# Patient Record
Sex: Male | Born: 1950
Health system: Southern US, Community
[De-identification: ages and names within clinical notes are randomized; demographics above are authoritative.]

## PROBLEM LIST (undated history)

## (undated) DIAGNOSIS — Z87438 Personal history of other diseases of male genital organs: Secondary | ICD-10-CM

## (undated) DIAGNOSIS — I10 Essential (primary) hypertension: Secondary | ICD-10-CM

## (undated) DIAGNOSIS — C61 Malignant neoplasm of prostate: Secondary | ICD-10-CM

## (undated) DIAGNOSIS — R351 Nocturia: Secondary | ICD-10-CM

## (undated) DIAGNOSIS — E785 Hyperlipidemia, unspecified: Secondary | ICD-10-CM

## (undated) DIAGNOSIS — Z973 Presence of spectacles and contact lenses: Secondary | ICD-10-CM

## (undated) DIAGNOSIS — N529 Male erectile dysfunction, unspecified: Secondary | ICD-10-CM

## (undated) DIAGNOSIS — Z87442 Personal history of urinary calculi: Secondary | ICD-10-CM

## (undated) DIAGNOSIS — N362 Urethral caruncle: Secondary | ICD-10-CM

## (undated) DIAGNOSIS — I2510 Atherosclerotic heart disease of native coronary artery without angina pectoris: Secondary | ICD-10-CM

## (undated) DIAGNOSIS — I2584 Coronary atherosclerosis due to calcified coronary lesion: Secondary | ICD-10-CM

## (undated) DIAGNOSIS — Z923 Personal history of irradiation: Secondary | ICD-10-CM

## (undated) DIAGNOSIS — R7303 Prediabetes: Secondary | ICD-10-CM

## (undated) HISTORY — DX: Atherosclerotic heart disease of native coronary artery without angina pectoris: I25.10

## (undated) HISTORY — DX: Essential (primary) hypertension: I10

## (undated) HISTORY — DX: Coronary atherosclerosis due to calcified coronary lesion: I25.84

## (undated) HISTORY — DX: Prediabetes: R73.03

## (undated) HISTORY — DX: Hyperlipidemia, unspecified: E78.5

## (undated) HISTORY — DX: Malignant neoplasm of prostate: C61

## (undated) HISTORY — DX: Male erectile dysfunction, unspecified: N52.9

## (undated) HISTORY — PX: OTHER SURGICAL HISTORY: SHX169

## (undated) HISTORY — DX: Personal history of urinary calculi: Z87.442

---

## 2006-08-09 ENCOUNTER — Ambulatory Visit (HOSPITAL_COMMUNITY): Admission: RE | Admit: 2006-08-09 | Discharge: 2006-08-09 | Payer: Self-pay | Admitting: *Deleted

## 2011-09-18 ENCOUNTER — Other Ambulatory Visit: Payer: Self-pay | Admitting: Urology

## 2011-10-05 ENCOUNTER — Encounter (HOSPITAL_BASED_OUTPATIENT_CLINIC_OR_DEPARTMENT_OTHER): Payer: Self-pay | Admitting: *Deleted

## 2011-10-05 NOTE — Progress Notes (Signed)
NPO AFTER MN. ARRIVES AT 0900. NEEDS ISTAT AND EKG.  

## 2011-10-08 NOTE — H&P (Signed)
oblems  1. Benign Prostatic Hypertrophy Without Urinary Obstruction 600.00 2. 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 PSA,Elevated 790.93  History of Present Illness     Brent Harris returns today in f/u.   He has a history of an elevated PSA that was up to 50.8 in Sept 2010 but fell with antibiotics and observation to 2.56 in 5/11.   He had a prostate biopsy in 2007 for an elevated PSA and was found to have chronic prostatitis.  His PSA in February was 9.43 and he had a month of Levaquin called in then.  His  PSA is 7.45 prior to this visit.   He didn't do well with his prior prostate biopsy and had prolonged ED that eventually resolved and he is doing well with that now since he quit drinking.  He has used Viagra at times.  He is voiding ok but has nocturia x 1.  He has some daytime frequency with high fluid intake.  he has no dysuria or hematuria.  He has a good stream without hesitancy. He had treatment for a cavity around the time of the last PSA.  He had some pain with that but it resolved with the Levaquin.   Past Medical History Problems  1. History of  Chronic Bacterial Prostatitis 601.1 2. History of  Complete Colonoscopy 3. History of  Hypercholesterolemia 272.0 4. History of  Hypertension 401.9 5. History of  Nephrolithiasis V13.01  Surgical History Problems  1. History of  Biopsy Of The Prostate Needle 2. History of  Colonoscopy (Fiberoptic)  Current Meds 1. Lisinopril TABS; Therapy: (Recorded:30Apr2008) to 2. Multi-Vitamin TABS; Therapy: (Recorded:30Apr2008) to 3. Red Yeast Rice Powder; Therapy: 10Oct2012 to 4. Viagra 100 MG Oral Tablet; Take as directed; Therapy: 25Jun2009 to (Evaluate:21Nov2013)   Requested for: 26Nov2012; Last Rx:26Nov2012  Allergies Medication  1. No Known Drug Allergies  Family History Problems  1. Maternal history of  Breast Cancer V16.3 2. Paternal history of  Glaucoma 3. Paternal history of  Prostate Cancer V16.42  Social History Problems  1. Alcohol  Use GLASS OF WINE but recently reduced from a bottle a day. 2. Caffeine Use 1 TO 2 CUPS 3. Marital History - Currently Married 4. Never A Smoker 5. Occupation: ARTIST - TECHNICIAN/ART [employer] Denied  6. Tobacco Use  Review of Systems Genitourinary, constitutional, skin, eye, otolaryngeal, hematologic/lymphatic, cardiovascular, pulmonary, endocrine, musculoskeletal, gastrointestinal, neurological and psychiatric system(s) were reviewed and pertinent findings if present are noted.  Gastrointestinal: no diarrhea and no constipation.  Constitutional: no fever.    Vitals Vital Signs [Data Includes: Last 1 Day]  10Apr2013 11:04AM  Blood Pressure: 140 / 82 Temperature: 96.9 F Heart Rate: 88  Physical Exam Constitutional: Well nourished and well developed . No acute distress.  Abdomen: The abdomen is rounded. The abdomen is soft and nontender.  No inguinal hernia is present on the right.  No inguinal hernia is present on the left.  Rectal: Rectal exam demonstrates normal sphincter tone, no tenderness and no masses. Estimated prostate size is 3+. The prostate has no nodularity and is not tender. The left seminal vesicle is nonpalpable. The right seminal vesicle is nonpalpable. The perineum is normal on inspection.  Genitourinary: Examination of the penis demonstrates no discharge, no masses, no lesions and a normal meatus. The scrotum is without lesions. The right epididymis is palpably normal and non-tender. The left epididymis is palpably normal and non-tender. The right testis is non-tender and without masses. The left testis is non-tender and without masses.  Lymphatics: The  femoral and inguinal nodes are not enlarged or tender.    Results/Data Urine [Data Includes: Last 1 Day]   10Apr2013  COLOR YELLOW   APPEARANCE CLEAR   SPECIFIC GRAVITY <1.005   pH 5.5   GLUCOSE NEG mg/dL  BILIRUBIN NEG   KETONE NEG mg/dL  BLOOD NEG   PROTEIN NEG mg/dL  UROBILINOGEN 0.2 mg/dL  NITRITE NEG    LEUKOCYTE ESTERASE NEG    Assessment Assessed  1. PSA,Elevated 790.93 2. 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 Benign Prostatic Hypertrophy Without Urinary Obstruction 600.00   His PSA has been up and down but it is down somewhat on recent antibiotics but it is still well above normal.  His exam remains benign and his voiding symptoms are minimal. He has mild issues with the ED.   Plan Health Maintenance (V70.0)  1. UA With REFLEX  Done: 10Apr2013 10:50AM PSA,Elevated (790.93)  2. CefTRIAXone Sodium 1 GM Injection Solution Reconstituted; INJECT 1  GM Intramuscular; To Be  Done: 10Apr2013; Status: HOLD FOR - Administration 3. Diazepam 10 MG Oral Tablet; Take 1 -2 po one hour prior to procedure; Therapy: 10Apr2013 to  (Last Rx:10Apr2013) 4. Gentamicin Sulfate 40 MG/ML Injection Solution; INJECT 80 MG Intramuscular; To Be Done:  10Apr2013; Status: HOLD FOR - Administration 5. Follow-up Office  Follow-up  Requested for: 10Apr2013 6. PROSTATE BIOPSY ULTRASOUND  Requested for: 10Apr2013   I think it is time that we repeat a biopsy and I have reviewed the risks of the procedure including bleeding, infection and voiding difficulty. I will give him valium to take prior to the visit and will give him gentamycin and rocephin the day of the biopsy since he has been on Levaquin recently.

## 2011-10-09 ENCOUNTER — Encounter (HOSPITAL_BASED_OUTPATIENT_CLINIC_OR_DEPARTMENT_OTHER): Payer: Self-pay | Admitting: Anesthesiology

## 2011-10-09 ENCOUNTER — Ambulatory Visit (HOSPITAL_BASED_OUTPATIENT_CLINIC_OR_DEPARTMENT_OTHER): Payer: BC Managed Care – PPO | Admitting: Anesthesiology

## 2011-10-09 ENCOUNTER — Ambulatory Visit (HOSPITAL_BASED_OUTPATIENT_CLINIC_OR_DEPARTMENT_OTHER)
Admission: RE | Admit: 2011-10-09 | Discharge: 2011-10-09 | Disposition: A | Discharge status: Home / Self Care | Payer: BC Managed Care – PPO | Source: Ambulatory Visit | Attending: Urology | Admitting: Urology

## 2011-10-09 ENCOUNTER — Encounter (HOSPITAL_BASED_OUTPATIENT_CLINIC_OR_DEPARTMENT_OTHER)
Admission: RE | Disposition: A | Discharge status: Home / Self Care | Payer: Self-pay | Source: Ambulatory Visit | Attending: Urology

## 2011-10-09 ENCOUNTER — Encounter (HOSPITAL_BASED_OUTPATIENT_CLINIC_OR_DEPARTMENT_OTHER): Payer: Self-pay | Admitting: *Deleted

## 2011-10-09 DIAGNOSIS — Z803 Family history of malignant neoplasm of breast: Secondary | ICD-10-CM | POA: Insufficient documentation

## 2011-10-09 DIAGNOSIS — Z8042 Family history of malignant neoplasm of prostate: Secondary | ICD-10-CM | POA: Insufficient documentation

## 2011-10-09 DIAGNOSIS — C61 Malignant neoplasm of prostate: Secondary | ICD-10-CM | POA: Insufficient documentation

## 2011-10-09 DIAGNOSIS — N400 Enlarged prostate without lower urinary tract symptoms: Secondary | ICD-10-CM | POA: Insufficient documentation

## 2011-10-09 DIAGNOSIS — I10 Essential (primary) hypertension: Secondary | ICD-10-CM | POA: Insufficient documentation

## 2011-10-09 DIAGNOSIS — N529 Male erectile dysfunction, unspecified: Secondary | ICD-10-CM | POA: Insufficient documentation

## 2011-10-09 DIAGNOSIS — E7800 Pure hypercholesterolemia, unspecified: Secondary | ICD-10-CM | POA: Insufficient documentation

## 2011-10-09 HISTORY — DX: Essential (primary) hypertension: I10

## 2011-10-09 HISTORY — DX: Nocturia: R35.1

## 2011-10-09 HISTORY — PX: PROSTATE BIOPSY: SHX241

## 2011-10-09 HISTORY — DX: Personal history of urinary calculi: Z87.442

## 2011-10-09 LAB — POCT I-STAT 4, (NA,K, GLUC, HGB,HCT): Sodium: 143 mEq/L (ref 135–145)

## 2011-10-09 SURGERY — BIOPSY, PROSTATE, RECTAL APPROACH, WITH US GUIDANCE
Anesthesia: General | Site: Rectum | Wound class: Contaminated

## 2011-10-09 MED ORDER — LIDOCAINE HCL (CARDIAC) 20 MG/ML IV SOLN
INTRAVENOUS | Status: DC | PRN
  Administered 2011-10-09: 60 mg via INTRAVENOUS

## 2011-10-09 MED ORDER — MEPERIDINE HCL 25 MG/ML IJ SOLN: 6.2500 mg | INTRAMUSCULAR | Status: DC | PRN

## 2011-10-09 MED ORDER — OXYCODONE HCL 5 MG PO TABS: 5.0000 mg | ORAL_TABLET | ORAL | Status: DC | PRN

## 2011-10-09 MED ORDER — MIDAZOLAM HCL 5 MG/5ML IJ SOLN
INTRAMUSCULAR | Status: DC | PRN
  Administered 2011-10-09: 2 mg via INTRAVENOUS

## 2011-10-09 MED ORDER — EPHEDRINE SULFATE 50 MG/ML IJ SOLN
INTRAMUSCULAR | Status: DC | PRN
  Administered 2011-10-09 (×2): 10 mg via INTRAVENOUS

## 2011-10-09 MED ORDER — LACTATED RINGERS IV SOLN
INTRAVENOUS | Status: DC
  Administered 2011-10-09: 100 mL/h via INTRAVENOUS

## 2011-10-09 MED ORDER — ACETAMINOPHEN 325 MG PO TABS: 650.0000 mg | ORAL_TABLET | ORAL | Status: DC | PRN

## 2011-10-09 MED ORDER — DEXTROSE 5 % IV SOLN
1.0000 g | Freq: Once | INTRAVENOUS | Status: AC
  Administered 2011-10-09: 10:00:00 via INTRAVENOUS

## 2011-10-09 MED ORDER — ACETAMINOPHEN 650 MG RE SUPP: 650.0000 mg | RECTAL | Status: DC | PRN

## 2011-10-09 MED ORDER — FENTANYL CITRATE 0.05 MG/ML IJ SOLN
INTRAMUSCULAR | Status: DC | PRN
  Administered 2011-10-09 (×2): 50 ug via INTRAVENOUS

## 2011-10-09 MED ORDER — ONDANSETRON HCL 4 MG/2ML IJ SOLN: 4.0000 mg | Freq: Four times a day (QID) | INTRAMUSCULAR | Status: DC | PRN

## 2011-10-09 MED ORDER — PROMETHAZINE HCL 25 MG/ML IJ SOLN: 6.2500 mg | INTRAMUSCULAR | Status: DC | PRN

## 2011-10-09 MED ORDER — SODIUM CHLORIDE 0.9 % IJ SOLN: 3.0000 mL | INTRAMUSCULAR | Status: DC | PRN

## 2011-10-09 MED ORDER — FENTANYL CITRATE 0.05 MG/ML IJ SOLN: 25.0000 ug | INTRAMUSCULAR | Status: DC | PRN

## 2011-10-09 MED ORDER — PROPOFOL 10 MG/ML IV EMUL
INTRAVENOUS | Status: DC | PRN
  Administered 2011-10-09: 200 mg via INTRAVENOUS

## 2011-10-09 MED ORDER — LACTATED RINGERS IV SOLN: INTRAVENOUS | Status: DC

## 2011-10-09 MED ORDER — SODIUM CHLORIDE 0.9 % IV SOLN: 250.0000 mL | INTRAVENOUS | Status: DC | PRN

## 2011-10-09 MED ORDER — GENTAMICIN SULFATE 40 MG/ML IJ SOLN
160.0000 mg | INTRAVENOUS | Status: AC
  Administered 2011-10-09: 160 mg via INTRAVENOUS

## 2011-10-09 MED ORDER — SODIUM CHLORIDE 0.9 % IJ SOLN: 3.0000 mL | Freq: Two times a day (BID) | INTRAMUSCULAR | Status: DC

## 2011-10-09 SURGICAL SUPPLY — 5 items
DRESSING TELFA 8X3 (GAUZE/BANDAGES/DRESSINGS) ×4 IMPLANT
GLOVE SURG SS PI 8.0 STRL IVOR (GLOVE) IMPLANT
SURGILUBE 2OZ TUBE FLIPTOP (MISCELLANEOUS) IMPLANT
TOWEL OR 17X24 6PK STRL BLUE (TOWEL DISPOSABLE) ×2 IMPLANT
UNDERPAD 30X30 INCONTINENT (UNDERPADS AND DIAPERS) ×2 IMPLANT

## 2011-10-09 NOTE — Anesthesia Procedure Notes (Signed)
Procedure Name: LMA Insertion Date/Time: 10/09/2011 10:12 AM Performed by: Renella Cunas D Pre-anesthesia Checklist: Patient identified, Emergency Drugs available, Suction available and Patient being monitored Patient Re-evaluated:Patient Re-evaluated prior to inductionOxygen Delivery Method: Circle System Utilized Preoxygenation: Pre-oxygenation with 100% oxygen Intubation Type: IV induction Ventilation: Mask ventilation without difficulty LMA: LMA inserted LMA Size: 4.0 Number of attempts: 1 Airway Equipment and Method: bite block Placement Confirmation: positive ETCO2 Tube secured with: Tape Dental Injury: Teeth and Oropharynx as per pre-operative assessment

## 2011-10-09 NOTE — Discharge Instructions (Addendum)
  Post Anesthesia Home Care Instructions  Activity: Get plenty of rest for the remainder of the day. A responsible adult should stay with you for 24 hours following the procedure.  For the next 24 hours, DO NOT: -Drive a car -Advertising copywriter -Drink alcoholic beverages -Take any medication unless instructed by your physician -Make any legal decisions or sign important papers.  Meals: Start with liquid foods such as gelatin or soup. Progress to regular foods as tolerated. Avoid greasy, spicy, heavy foods. If nausea and/or vomiting occur, drink only clear liquids until the nausea and/or vomiting subsides. Call your physician if vomiting continues.  Special Instructions/Symptoms: Your throat may feel dry or sore from the anesthesia or the breathing tube placed in your throat during surgery. If this causes discomfort, gargle with warm salt water. The discomfort should disappear within 24 hours.     Prostate Biopsy TRUS Biopsy BEFORE THE TEST   Do not take aspirin. Do not take any medicine that has aspirin in it 7 days before your biopsy.   You may be given a medicine to take on the day of your biopsy.   You may also be given a medicine or treatment to help you go poop (laxative or enema).  AFTER THE TEST  Only take medicine as told by your doctor.   It is normal to have some bleeding from your rectum for the first 5 days.   You may have blood in your pee (urine) or sperm.  Finding out the results of your test Ask when your test results will be ready. Make sure you get your test results. GET HELP RIGHT AWAY IF:  You have a temperature by mouth above 102 F (38.9 C), not controlled by medicine.   You have blood in your pee for more than 5 days.   You have a lot of blood in your pee.   You have bleeding from your rectum for more than 5 days or have a lot of blood in your poop (feces).   You have severe pain.  Document Released: 05/02/2009 Document Revised: 05/03/2011  Document Reviewed: 05/02/2009 [facility] Patient Information 2012 [place], [place].

## 2011-10-09 NOTE — Anesthesia Preprocedure Evaluation (Signed)
Anesthesia Evaluation  Patient identified by MRN, date of birth, ID band Patient awake    Reviewed: Allergy & Precautions, H&P , NPO status , Patient's Chart, lab work & pertinent test results, reviewed documented beta blocker date and time   Airway Mallampati: II TM Distance: >3 FB Neck ROM: full    Dental No notable dental hx.    Pulmonary neg pulmonary ROS,  breath sounds clear to auscultation  Pulmonary exam normal       Cardiovascular Exercise Tolerance: Good hypertension, Pt. on medications negative cardio ROS  Rhythm:regular Rate:Normal     Neuro/Psych negative neurological ROS  negative psych ROS   GI/Hepatic negative GI ROS, Neg liver ROS,   Endo/Other  negative endocrine ROS  Renal/GU negative Renal ROS  negative genitourinary   Musculoskeletal   Abdominal   Peds  Hematology negative hematology ROS (+)   Anesthesia Other Findings   Reproductive/Obstetrics negative OB ROS                           Anesthesia Physical Anesthesia Plan  ASA: II  Anesthesia Plan: General   Post-op Pain Management:    Induction:   Airway Management Planned:   Additional Equipment:   Intra-op Plan:   Post-operative Plan:   Informed Consent: I have reviewed the patients History and Physical, chart, labs and discussed the procedure including the risks, benefits and alternatives for the proposed anesthesia with the patient or authorized representative who has indicated his/her understanding and acceptance.   Dental Advisory Given  Plan Discussed with: CRNA  Anesthesia Plan Comments:         Anesthesia Quick Evaluation

## 2011-10-09 NOTE — Op Note (Signed)
Preop Dx:  Elevated PSA.  Postop Dx: Same.  Procedure:  Transrectal prostate [place] with biopsy.  Surgeon: Bjorn Pippin MD.  Anesthesia: General.  Specimen: 16 prostate cores.  EBL: Minimal.  Comp: none:  Indications:  Mr. Brent Harris is a 61 yo WM with an elevated PSA and benign exam who is to undergo prostate biopsy.  Procedure:  He was given Rocephin and Gentamycin and was taken to the ER where a general anesthetic was induced.   He was turned to the left lateral decubitus postion and the US probe was inserted.   The prostate was imaged and he was found to have a 77cc prostate with a height of 4.4cm, length of 5.7cm and a width of 5.88cm.  The seminal vesicals were normal.  The TZ was enlarged but without lesions.  The PZ had no hypoechogenic lesions and a normal echotexture.  There were a few central calcifications.      After completion of the diagnostic scan, 16 biopsy cores were obtained with 4 at the base, 4 at the mid-base level, 4 at the mid prostate and 4 at the apex with medial and lateral biopsies on the right and left at each level.   There was minimal bleeding with the biopsies.      He was rolled back supine and his anesthetic was reversed.  He was moved to the recovery room in stable condition.  There were no complications.

## 2011-10-09 NOTE — Interval H&P Note (Signed)
History and Physical Interval Note:  10/09/2011 8:20 AM  Brent Harris  has presented today for surgery, with the diagnosis of Elevated Prostatic Specific Antigen  The various methods of treatment have been discussed with the patient and family. After consideration of risks, benefits and other options for treatment, the patient has consented to  Procedure(s) (LRB): BIOPSY TRANSRECTAL ULTRASONIC PROSTATE (TUBP) (N/A) as a surgical intervention .  The patients' history has been reviewed, patient examined, no change in status, stable for surgery.  I have reviewed the patients' chart and labs.  Questions were answered to the patient's satisfaction.     [name],[name]

## 2011-10-09 NOTE — Progress Notes (Signed)
C/o nausea.  Pt dressed , sitting chair preparing for d/c.  Skin w/d, color pale.  Encouraged to sit. Feet elevated

## 2011-10-09 NOTE — Transfer of Care (Signed)
Immediate Anesthesia Transfer of Care Note  Patient: Brent Harris  Procedure(s) Performed: Procedure(s) (LRB): BIOPSY TRANSRECTAL ULTRASONIC PROSTATE (TUBP) (N/A)  Patient Location: PACU  Anesthesia Type: General  Level of Consciousness: awake, oriented, sedated and patient cooperative  Airway & Oxygen Therapy: Patient Spontanous Breathing and Patient connected to face mask oxygen  Post-op Assessment: Report given to PACU RN and Post -op Vital signs reviewed and stable  Post vital signs: Reviewed and stable  Complications: No apparent anesthesia complications

## 2011-10-09 NOTE — Anesthesia Postprocedure Evaluation (Signed)
  Anesthesia Post-op Note  Patient: Brent Harris  Procedure(s) Performed: Procedure(s) (LRB): BIOPSY TRANSRECTAL ULTRASONIC PROSTATE (TUBP) (N/A)  Patient Location: PACU  Anesthesia Type: General  Level of Consciousness: awake and alert   Airway and Oxygen Therapy: Patient Spontanous Breathing  Post-op Pain: mild  Post-op Assessment: Post-op Vital signs reviewed, Patient's Cardiovascular Status Stable, Respiratory Function Stable, Patent Airway and No signs of Nausea or vomiting  Post-op Vital Signs: stable  Complications: No apparent anesthesia complications

## 2011-10-10 ENCOUNTER — Encounter (HOSPITAL_BASED_OUTPATIENT_CLINIC_OR_DEPARTMENT_OTHER): Payer: Self-pay | Admitting: Urology

## 2011-10-15 ENCOUNTER — Encounter (HOSPITAL_BASED_OUTPATIENT_CLINIC_OR_DEPARTMENT_OTHER): Payer: Self-pay

## 2011-10-27 DIAGNOSIS — C61 Malignant neoplasm of prostate: Secondary | ICD-10-CM

## 2011-10-27 HISTORY — DX: Malignant neoplasm of prostate: C61

## 2011-11-06 NOTE — Progress Notes (Signed)
GLEASON SCORE....3 + 3 = 6 T1cNxMx  PROSTATE CANCER.  PROSTATE VOLUME 77cc.  2 cores with 15% INVOLVEMENT at the left medial apex and 20% of the right lateral base.  Has had increase in frequency and nocturia since and has been able to have intercourse.  PSA.....790.93 ON 10/25/11  ALCOHOL....YES ONE GLASS OF WINE/DAY  NEVER SMOKER  MARRIED   NKA

## 2011-11-07 ENCOUNTER — Ambulatory Visit
Admission: RE | Admit: 2011-11-07 | Discharge: 2011-11-07 | Disposition: A | Discharge status: Home / Self Care | Payer: BC Managed Care – PPO | Source: Ambulatory Visit | Attending: Radiation Oncology | Admitting: Radiation Oncology

## 2011-11-07 ENCOUNTER — Encounter: Payer: Self-pay | Admitting: Radiation Oncology

## 2011-11-07 VITALS — BP 128/88 | HR 70 | Temp 97.8°F | Resp 18 | Ht 70.5 in | Wt 164.0 lb

## 2011-11-07 DIAGNOSIS — Z510 Encounter for antineoplastic radiation therapy: Secondary | ICD-10-CM | POA: Insufficient documentation

## 2011-11-07 DIAGNOSIS — C61 Malignant neoplasm of prostate: Secondary | ICD-10-CM

## 2011-11-07 DIAGNOSIS — I10 Essential (primary) hypertension: Secondary | ICD-10-CM | POA: Insufficient documentation

## 2011-11-07 DIAGNOSIS — Z79899 Other long term (current) drug therapy: Secondary | ICD-10-CM | POA: Insufficient documentation

## 2011-11-07 NOTE — Progress Notes (Signed)
Radiation Oncology         [phone] ________________________________  Initial outpatient Consultation  Name: Brent Harris MRN: [id number]  Date: 11/07/2011  DOB: 1950-10-06  WU:[id number], Excell Seltzer, MD , Merri Brunette, MD  REFERRING PHYSICIAN: Anner Crete, MD  DIAGNOSIS: Stage T1c Gleason's 6 (3+3) Adenocarcinoma of the Prostate  HISTORY OF PRESENT ILLNESS::Brent Harris is a 61 y.o. male who is seen out of the courtesy of Dr. Bjorn Pippin for an opinion concerning radiation therapy as part of management of the patient's recently diagnosed prostate cancer. Mr. Ksiazek has been seen by Dr. Annabell Howells for past few years. Patient has been diagnosed with chronic bacterial prostatitis. His PSA in September of 2010 with actually up to 50.8 however with antibiotic therapy this dropped to 2.56. More recently a recent PSA was elevated and despite the month of Levaquin the patient's PSA remained elevated at 7.45.  He did undergo transrectal ultrasound and biopsy. Biopsies from the right base lateral showed a Gleason score 6 (3+3). This  involved 20% of the core. In addition from the left apical medial area prostate cancer was recovered with a Gleason score of 6. This involved 15% of tissue submitted. Remainder of the biopsies showed benign disease.  Patient is now exploring his options for treatment.   PREVIOUS RADIATION THERAPY: No  PAST MEDICAL HISTORY:  has a past medical history of Elevated PSA; Nocturia; History of kidney stones; and Hypertension.    PAST SURGICAL HISTORY: Past Surgical History  Procedure Date  . Surg for arm fx AS TEENAGER  . Prostate biopsy 10/09/2011    Procedure: BIOPSY TRANSRECTAL ULTRASONIC PROSTATE (TUBP);  Surgeon: Anner Crete, MD;  Location: [facility];  Service: Urology;  Laterality: N/A;  1 hour requested for this case  [facility] to bring Ultrasound Equiphment    FAMILY HISTORY:  Significant for follow with history of prostate cancer  diagnosed in his 62s. He was treated with radiation therapy in the [place] area.  SOCIAL HISTORY:  reports that he has quit smoking. His smoking use included Cigarettes. He quit after 2 years of use. He does not have any smokeless tobacco history on file. He reports that he drinks alcohol. He reports that he does not use illicit drugs.  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  ALLERGIES: Nutritional supplements and Onion  MEDICATIONS:  Current Outpatient Prescriptions  Medication Sig Dispense Refill  . cholecalciferol (VITAMIN D) 1000 UNITS tablet Take 1,000 Units by mouth daily.      . fish oil-omega-3 fatty acids 1000 MG capsule Take 2 g by mouth daily.      Marland Kitchen lisinopril (PRINIVIL,ZESTRIL) 20 MG tablet Take 20 mg by mouth daily.      . Multiple Vitamin (MULTIVITAMIN) tablet Take 1 tablet by mouth daily.        REVIEW OF SYSTEMS:  A 15 point review of systems is documented in the electronic medical record. This was obtained by the nursing staff. However, I reviewed this with the patient to discuss relevant findings and make appropriate changes.  Patient typically has nocturia one to 2 times.  He occasionally will have some slowing of his urinary stream in early morning. He has very mild erectile dysfunction which is helped with the Viagra.  Patient completed the international prostate symptom score with total score of 5 representing minimal symptomatology. Most significant scores were and nocturia and weak urinary stream.   PHYSICAL EXAM:  Temperature 97.8 pulse 70 blood pressure 128/88 weight 164 pounds height 5 feet 10-1/2 inches.  The pupils are equal round and reactive to light. The extraocular eye movements are intact. The tongue is midline. There is no secondary infection noted in the oral cavity or posterior pharynx. Patient's teeth are in good repair. Examination of the neck and supraclavicular region reveals no evidence of adenopathy. The axillary areas  are free of adenopathy. Examination of the lungs fields and be clear. The heart has a regular rhythm and rate. Examination of the abdomen reveals to be soft and nontender with normal bowel sounds. The inguinal areas are free of adenopathy. The patient is a normal circumcised male. The testicles are soft without masses. On rectal exam sphincter tone is noted to be normal. Prostate is significantly enlarged at approximately 3+. There's no palpable nodules within the prostate gland.  Extremities reveals no cyanosis clubbing or edema. On neurological examination motor strength is 5 out of 5 in the proximal distal muscle groups in the upper and lower extremities.   LABORATORY DATA:  Lab Results  Component Value Date   HGB 14.6 10/09/2011   HCT 43.0 10/09/2011   Lab Results  Component Value Date   NA 143 10/09/2011   K 4.0 10/09/2011   No results found for this basename: ALT, AST, GGT, ALKPHOS, BILITOT  PSA  7.45   RADIOGRAPHY: No results found.    IMPRESSION: Stage TIc Gleason's 6 adenocarcinoma prostate. I discussed with the patient that his prognosis relates to his presenting PSA, Gleason score and clinical stage. All these factors are favorable for Mr. Delpriore. Given the patient's young age I would not be in favor of watchful waiting. Given the size of the patient's prostate gland he would not be a candidate for radioactive seed implantation. Patient does have 2 good options for treatment that being prostatectomy and external beam radiation therapy. I discussed the pros and cons of both of these treatments approaches in detail. In addition the patient has had in depth discussion with Dr. Annabell Howells concerning radical prostatectomy. In light of  the patient's  age I would be more in favor of proceeding with surgery first reserving radiation for salvage if necessary. However the patient would have equal outcomes with surgery or radiation therapy in this situation. I have asked the patient to call me or Dr.  Annabell Howells once he is made a decision concerning his treatment approach I spent 60 minutes minutes face to face with the patient and more than 50% of that time was spent in counseling and/or coordination of care.   ------------------------------------------------   Billie Lade, PhD, MD

## 2011-12-11 ENCOUNTER — Telehealth: Payer: Self-pay | Admitting: *Deleted

## 2011-12-11 NOTE — Telephone Encounter (Signed)
CALLED PATIENT TO INFORM OF APPTS. AND SIM ON 12-24-11, AND HIS GOLD SEED PLACEMENT ON 12/18/11, ARRIVAL TIME 10:45 AM, LVM FOR A RETURN CALL

## 2011-12-24 ENCOUNTER — Ambulatory Visit: Payer: BC Managed Care – PPO

## 2011-12-24 ENCOUNTER — Ambulatory Visit
Admission: RE | Admit: 2011-12-24 | Discharge: 2011-12-24 | Disposition: A | Discharge status: Home / Self Care | Payer: BC Managed Care – PPO | Source: Ambulatory Visit | Attending: Radiation Oncology | Admitting: Radiation Oncology

## 2011-12-24 ENCOUNTER — Ambulatory Visit
Admission: RE | Admit: 2011-12-24 | Payer: BC Managed Care – PPO | Source: Ambulatory Visit | Admitting: Radiation Oncology

## 2011-12-24 DIAGNOSIS — C61 Malignant neoplasm of prostate: Secondary | ICD-10-CM

## 2011-12-24 NOTE — Progress Notes (Signed)
Met with patient to discuss RO billing.  Dx: 185 Malignant neoplasm of prostate  Attending Rad: Dr. Marian Sorrow Tx: [id number] IMRT x 40

## 2011-12-25 NOTE — Progress Notes (Signed)
  Radiation Oncology         [phone] ________________________________  Name: Brent Harris MRN: [id number]  Date: 12/24/2011  DOB: 09/02/1950  SIMULATION AND TREATMENT PLANNING NOTE  DIAGNOSIS:  Stage TIC Gleason's  6 adenocarcinoma the prostate  NARRATIVE:  The patient was brought to the CT Simulation planning suite.  Identity was confirmed.  All relevant records and images related to the planned course of therapy were reviewed.  The patient freely provided informed written consent to proceed with treatment after reviewing the details related to the planned course of therapy. The consent form was witnessed and verified by the simulation staff.  Then, the patient was set-up in a stable reproducible  supine position for radiation therapy.  CT images were obtained.  Surface markings were placed.  The CT images were loaded into the planning software.  Then the target and avoidance structures were contoured.  Treatment planning then occurred.  The radiation prescription was entered and confirmed.  A total of 1 complex treatment devices were fabricated. I have requested : Intensity Modulated Radiotherapy (IMRT) is medically necessary for this case for the following reason:  Rectal sparing..  I have ordered:daily mvct scan and image guided therapy.  PLAN:  The patient will receive 78.0 Gy in 40 fractions.  ________________________________   Billie Lade, PhD, MD

## 2011-12-28 ENCOUNTER — Encounter: Payer: Self-pay | Admitting: *Deleted

## 2011-12-31 ENCOUNTER — Encounter: Payer: Self-pay | Admitting: Radiation Oncology

## 2011-12-31 NOTE — Progress Notes (Signed)
IMRT simulation/treatment planning note: Brent Harris underwent IMRT simulation/treatment planning in the management of his carcinoma the prostate. IMRT was chosen to decrease the risk for both acute and late bladder and rectal toxicity compared to 3-D conformal and conventional radiation therapy. Dose volume histograms were obtained for the prostate and also avoidance structures including the bladder, rectum, and femoral heads. We easily met the departmental target and avoidance goals. Please see the electronic medical record for specific dose volume histograms. Dr. Eustaquio Boyden prescribing 7800 cGy in 40 sessions utilizing 6 MV photons helical IMRT Tomotherapy. He is to be treated with a company full bladder and will undergo daily MV CT setting up to his gold seeds while observing the rectal/prostate interface.

## 2012-01-02 ENCOUNTER — Ambulatory Visit: Payer: BC Managed Care – PPO

## 2012-01-03 ENCOUNTER — Ambulatory Visit
Admission: RE | Admit: 2012-01-03 | Discharge: 2012-01-03 | Disposition: A | Discharge status: Home / Self Care | Payer: BC Managed Care – PPO | Source: Ambulatory Visit | Attending: Radiation Oncology | Admitting: Radiation Oncology

## 2012-01-03 ENCOUNTER — Encounter: Payer: Self-pay | Admitting: Radiation Oncology

## 2012-01-03 VITALS — Resp 18 | Wt 164.5 lb

## 2012-01-03 DIAGNOSIS — C61 Malignant neoplasm of prostate: Secondary | ICD-10-CM

## 2012-01-03 NOTE — Progress Notes (Signed)
Received patient in the clinic tonight for PUT with Dr. Mitzi Hansen. Patient is alert and oriented to person, place, and time. No distress noted. Steady gait noted. Pleasant affect noted. Patient denies pain at this time. Patient reports, "I haven't been too symptomatic lately." Patient reports on average he gets up 2-3 times per night to void. Patient denies hematuria. Patient denies incontinence. Patient denies diarrhea. Patient reports he is not as active sexually as he was prior. Patient reports he is able to still get an erection but, has no ejaculation. Patient has several questions about intercourse for Dr. Mitzi Hansen. Reported all finding to Dr. Mitzi Hansen.

## 2012-01-03 NOTE — Progress Notes (Signed)
   Department of Radiation Oncology  Phone:  [phone] Fax:        [phone]  Weekly Treatment Note    Name: Brent Harris Date: 01/03/2012 MRN: [id number] DOB: June 28, 1950   Current dose: 1.95 Gy  Current fraction: 1   MEDICATIONS: Current Outpatient Prescriptions  Medication Sig Dispense Refill  . cholecalciferol (VITAMIN D) 1000 UNITS tablet Take 1,000 Units by mouth daily.      . fish oil-omega-3 fatty acids 1000 MG capsule Take 2 g by mouth daily.      Marland Kitchen lisinopril (PRINIVIL,ZESTRIL) 20 MG tablet Take 20 mg by mouth daily.      . Multiple Vitamin (MULTIVITAMIN) tablet Take 1 tablet by mouth daily.         ALLERGIES: Nutritional supplements and Onion   LABORATORY DATA:  Lab Results  Component Value Date   HGB 14.6 10/09/2011   HCT 43.0 10/09/2011   Lab Results  Component Value Date   NA 143 10/09/2011   K 4.0 10/09/2011   No results found for this basename: ALT, AST, GGT, ALKPHOS, BILITOT     NARRATIVE: Brent Harris was seen today for weekly treatment management. The chart was checked and the patient's films were reviewed. The patient has begun his treatment. He received his first fraction today and this went fine. No difficulty so far. The patient did have several questions.  PHYSICAL EXAMINATION: weight is 164 lb 8 oz (74.617 kg). His respiration is 18.        ASSESSMENT: The patient is doing satisfactorily with treatment.  PLAN: We will continue with the patient's radiation treatment as planned.

## 2012-01-04 ENCOUNTER — Ambulatory Visit
Admission: RE | Admit: 2012-01-04 | Discharge: 2012-01-04 | Disposition: A | Discharge status: Home / Self Care | Payer: BC Managed Care – PPO | Source: Ambulatory Visit | Attending: Radiation Oncology | Admitting: Radiation Oncology

## 2012-01-07 ENCOUNTER — Ambulatory Visit
Admission: RE | Admit: 2012-01-07 | Discharge: 2012-01-07 | Disposition: A | Discharge status: Home / Self Care | Payer: BC Managed Care – PPO | Source: Ambulatory Visit | Attending: Radiation Oncology | Admitting: Radiation Oncology

## 2012-01-07 DIAGNOSIS — C61 Malignant neoplasm of prostate: Secondary | ICD-10-CM

## 2012-01-07 NOTE — Progress Notes (Signed)
   Department of Radiation Oncology  Phone:  [phone] Fax:        [phone]   Intensity modulated radiation therapy device note  On 01/03/2012 the patient began his radiation therapy. He also had construction of his IMRT device. Patient treated with helical IMRT. He  will be treated with 4.7 sinogram segments. This constitutes 1 intensity modulated radiation therapy device.  -----------------------------------  Billie Lade, PhD, MD

## 2012-01-08 ENCOUNTER — Ambulatory Visit
Admission: RE | Admit: 2012-01-08 | Discharge: 2012-01-08 | Disposition: A | Discharge status: Home / Self Care | Payer: BC Managed Care – PPO | Source: Ambulatory Visit | Attending: Radiation Oncology | Admitting: Radiation Oncology

## 2012-01-08 ENCOUNTER — Other Ambulatory Visit: Payer: Self-pay | Admitting: Radiation Oncology

## 2012-01-08 ENCOUNTER — Encounter: Payer: Self-pay | Admitting: Radiation Oncology

## 2012-01-08 VITALS — BP 137/87 | HR 58 | Wt 166.1 lb

## 2012-01-08 DIAGNOSIS — R319 Hematuria, unspecified: Secondary | ICD-10-CM | POA: Insufficient documentation

## 2012-01-08 DIAGNOSIS — R3915 Urgency of urination: Secondary | ICD-10-CM | POA: Insufficient documentation

## 2012-01-08 DIAGNOSIS — R300 Dysuria: Secondary | ICD-10-CM | POA: Insufficient documentation

## 2012-01-08 DIAGNOSIS — C61 Malignant neoplasm of prostate: Secondary | ICD-10-CM

## 2012-01-08 DIAGNOSIS — R351 Nocturia: Secondary | ICD-10-CM | POA: Insufficient documentation

## 2012-01-08 LAB — URINALYSIS, MICROSCOPIC - CHCC
Bilirubin (Urine): NEGATIVE
Blood: NEGATIVE
Ketones: NEGATIVE mg/dL
Protein: NEGATIVE mg/dL
RBC / HPF: NEGATIVE (ref 0–2)
Specific Gravity, Urine: 1.005 (ref 1.003–1.035)
pH: 6 (ref 4.6–8.0)

## 2012-01-08 NOTE — Progress Notes (Addendum)
States he has dysuria and nocturia 5-6 times last pm since he was catheterized for his simulation. But grades dysuria as less than a 1 on a scale of 0-10.  Hematuria absent.  Urine for U/A and C&S obtained on yesterday.  Dr. Roselind Messier will review results with Brent Harris today.

## 2012-01-08 NOTE — Progress Notes (Signed)
[facility]    Radiation Oncology [address] [place]     Maryln Gottron, M.D. [place], [place] [address]               Billie Lade, M.D., Ph.D. Phone: [phone]      Molli Hazard A. Kathrynn Running, M.D. Fax: [phone]      Radene Gunning, M.D., Ph.D.         Lurline Hare, M.D.         Grayland Jack, M.D Weekly Treatment Management Note  Name: Brent Harris     MRN: [id number]        CSN: [id number] Date: 01/08/2012      DOB: 09-Jul-1950  CC: Brent Moh, MD         [place]    Status: Outpatient  Diagnosis: There were no encounter diagnoses.  Current Dose: 7.8  Gy  Current Fraction: 4  Planned Dose: 78.0 Gy  Narrative: Brent Harris was seen today for weekly treatment management. The chart was checked and MVCT  were reviewed.  Over the weekend the patient developed hematuria. In addition the patient developed problems with dysuria and urinary urgency as well as nocturia. Patient admits to nocturia approximately 4-5 times last night. In light of these issues the patient did present earlier for  urinalysis and culture. Preliminary review shows no significant signs of infection. There is no blood noted in the urine specimen. The culture is pending at this time.  He denies any bowel complaints at this time.  Patient has been taking 325 mg of aspirin daily. I recommended he cut down to 81 mg enteric coated. patient has no history of heart disease or stroke.  Nutritional supplements and Onion Current Outpatient Prescriptions  Medication Sig Dispense Refill  . cholecalciferol (VITAMIN D) 1000 UNITS tablet Take 1,000 Units by mouth daily.      Marland Kitchen lisinopril (PRINIVIL,ZESTRIL) 20 MG tablet Take 20 mg by mouth daily.      . Multiple Vitamin (MULTIVITAMIN) tablet Take 1 tablet by mouth daily.       Labs:  Lab Results  Component Value Date   HGB 14.6 10/09/2011   HCT 43.0 10/09/2011   Lab Results  Component Value Date   NA 143 10/09/2011   K 4.0 10/09/2011    No results found for this basename: ALT, AST, GGT, ALK, PHOS, BILITOT    Physical Examination:  weight is 166 lb 1.6 oz (75.342 kg). His blood pressure is 137/87 and his pulse is 58.    Wt Readings from Last 3 Encounters:  01/08/12 166 lb 1.6 oz (75.342 kg)  01/03/12 164 lb 8 oz (74.617 kg)  11/07/11 164 lb (74.39 kg)     Lungs - Normal respiratory effort, chest expands symmetrically. Lungs are clear to auscultation, no crackles or wheezes.  Heart has regular rhythm and rate  Abdomen is soft and non tender with normal bowel sounds  Assessment:  Patient is having issues as above. It is certainly very early in his course of treatment to have problems as above. We will await the urine culture to rule out infection causing the symptoms.  Patient does have an enlarged prostate and this may be contributing to his early presentation of symptoms.  I offered consideration for Pyridium or Flomax but at this time the patient would like to treat with over-the-counter medications. I  did recommend that he use Advil or Aleve with food to see if this will help with his  symptoms.  Plan: Continue treatment per original radiation prescription

## 2012-01-09 ENCOUNTER — Ambulatory Visit
Admission: RE | Admit: 2012-01-09 | Discharge: 2012-01-09 | Disposition: A | Discharge status: Home / Self Care | Payer: BC Managed Care – PPO | Source: Ambulatory Visit | Attending: Radiation Oncology | Admitting: Radiation Oncology

## 2012-01-10 ENCOUNTER — Ambulatory Visit
Admission: RE | Admit: 2012-01-10 | Discharge: 2012-01-10 | Disposition: A | Discharge status: Home / Self Care | Payer: BC Managed Care – PPO | Source: Ambulatory Visit | Attending: Radiation Oncology | Admitting: Radiation Oncology

## 2012-01-10 LAB — URINE CULTURE

## 2012-01-11 ENCOUNTER — Ambulatory Visit
Admission: RE | Admit: 2012-01-11 | Discharge: 2012-01-11 | Disposition: A | Discharge status: Home / Self Care | Payer: BC Managed Care – PPO | Source: Ambulatory Visit | Attending: Radiation Oncology | Admitting: Radiation Oncology

## 2012-01-11 ENCOUNTER — Encounter: Payer: Self-pay | Admitting: Radiation Oncology

## 2012-01-11 VITALS — BP 137/90 | HR 67 | Temp 97.0°F | Resp 20 | Wt 167.8 lb

## 2012-01-11 DIAGNOSIS — C61 Malignant neoplasm of prostate: Secondary | ICD-10-CM

## 2012-01-11 NOTE — Progress Notes (Signed)
Patient  Alert,oriented x3, saw Mdf Tuesday, neg. U/a and culture, not taking ASA anymore," I drink tons of water up to 1230 at night, nocturia 4-5x, mostly complains today of generalized achiness still at lower abdomen,: he  is taking advil 1 200mg  oral every  6 hours as needed", ,advil is working great", last advil taken today at 0830am, ," stream is weaker at night, describes dysuria as warm discomfort at times, not every tim he voids, bowel movemment s are good stated patient" 10:40 AM

## 2012-01-11 NOTE — Progress Notes (Signed)
  Radiation Oncology         [phone] ________________________________  Name: Brent Harris MRN: [id number]  Date: 01/11/2012  DOB: Jan 04, 1951  Weekly Radiation Therapy Management  Current Dose: 13.65 Gy     Planned Dose:  78 Gy  Narrative . . . . . . . . The patient presents for routine under treatment assessment.                                                     The patient had hematuria which is better, an mild dysuria.                                 Set-up films were reviewed.                                 The chart was checked. Physical Findings. . Ceasar Mons Vitals:   01/11/12 1037  BP: 137/90  Pulse: 67  Temp: 97 F (36.1 C)  Resp: 20   Weight essentially stable.  No significant changes. Impression . . . . . . . The patient is  tolerating radiation. Plan . . . . . . . . . . . . Continue treatment as planned.  ________________________________  Artist Pais. Kathrynn Running, M.D.

## 2012-01-14 ENCOUNTER — Encounter: Payer: Self-pay | Admitting: Radiation Oncology

## 2012-01-14 ENCOUNTER — Telehealth: Payer: Self-pay | Admitting: Radiation Oncology

## 2012-01-14 ENCOUNTER — Ambulatory Visit
Admission: RE | Admit: 2012-01-14 | Discharge: 2012-01-14 | Disposition: A | Discharge status: Home / Self Care | Payer: BC Managed Care – PPO | Source: Ambulatory Visit | Attending: Radiation Oncology | Admitting: Radiation Oncology

## 2012-01-14 VITALS — BP 143/92 | HR 64 | Temp 97.1°F | Resp 20 | Wt 170.1 lb

## 2012-01-14 DIAGNOSIS — C61 Malignant neoplasm of prostate: Secondary | ICD-10-CM

## 2012-01-14 NOTE — Progress Notes (Signed)
[facility]    Radiation Oncology [address] [place]     Maryln Gottron, M.D. [place], [place] [address]               Billie Lade, M.D., Ph.D. Phone: [phone]      Molli Hazard A. Kathrynn Running, M.D. Fax: [phone]      Radene Gunning, M.D., Ph.D.         Lurline Hare, M.D.         Grayland Jack, M.D Weekly Treatment Management Note  Name: Brent Harris     MRN: [id number]        CSN: [id number] Date: 01/14/2012      DOB: [birth year]  CC: [name], MD         Ph[place]    Status: Outpatient  Diagnosis: The encounter diagnosis was Prostate cancer.  Current Dose: 15.6 Gy  Current Fraction: 8  Planned Dose: 78.0 Gy  Narrative: Brent Harris was seen today for weekly treatment management. The chart was checked and MVCT  were reviewed. He continues to have some dysuria which is slightly worsened over the weekend. Patient in addition has urinary frequency and some urgency. He has nocturia every 2 hours in the evening. The patient at this time does wish to proceed with medication and have placed him on Flomax 0.4 mg at bedtime. I cautioned the patient concerning hypotension.  Nutritional supplements and Onion Current Outpatient Prescriptions  Medication Sig Dispense Refill  . cholecalciferol (VITAMIN D) 1000 UNITS tablet Take 1,000 Units by mouth daily.      Marland Kitchen ibuprofen (ADVIL,MOTRIN) 200 MG tablet Take 200 mg by mouth every 6 (six) hours as needed.      Marland Kitchen lisinopril (PRINIVIL,ZESTRIL) 20 MG tablet Take 20 mg by mouth daily.      . Multiple Vitamin (MULTIVITAMIN) tablet Take 1 tablet by mouth daily.       Labs:  Lab Results  Component Value Date   HGB 14.6 10/09/2011   HCT 43.0 10/09/2011   Lab Results  Component Value Date   NA 143 10/09/2011   K 4.0 10/09/2011   No results found for this basename: ALT, AST, GGT, ALK, PHOS, BILITOT    Physical Examination:  weight is 170 lb 1.6 oz (77.157 kg). His oral temperature is 97.1 F (36.2 C). His blood  pressure is 143/92 and his pulse is 64. His respiration is 20.    Wt Readings from Last 3 Encounters:  01/14/12 170 lb 1.6 oz (77.157 kg)  01/11/12 167 lb 12.8 oz (76.114 kg)  01/08/12 166 lb 1.6 oz (75.342 kg)     Lungs - Normal respiratory effort, chest expands symmetrically. Lungs are clear to auscultation, no crackles or wheezes.  Heart has regular rhythm and rate  Abdomen is soft and non tender with normal bowel sounds  Assessment:  Patient  continues to have urinary symptoms. As above he will proceed with medication.  Plan: Continue treatment per original radiation prescription

## 2012-01-14 NOTE — Progress Notes (Signed)
Pt reports general pelvic discomfort, nocturia, daytime freq q 2hr, pressure, some urgency and dysuria. Pt states he drinks "lots of water".

## 2012-01-14 NOTE — Progress Notes (Signed)
Post sim ed completed w/pt; gave pt "Radiation and You" booklet w/pertinent pages marked and discussed. All questions answered.

## 2012-01-14 NOTE — Telephone Encounter (Signed)
Per Dr. Trina Ao order called in Flomax 0.4 mg one tablet by mouth at bedtime script to [facility] on [address], phone number [phone]. Issue qty 30 with 2 refills. Spoke with Ebony Cargo at [facility].

## 2012-01-15 ENCOUNTER — Ambulatory Visit
Admission: RE | Admit: 2012-01-15 | Discharge: 2012-01-15 | Disposition: A | Discharge status: Home / Self Care | Payer: BC Managed Care – PPO | Source: Ambulatory Visit | Attending: Radiation Oncology | Admitting: Radiation Oncology

## 2012-01-16 ENCOUNTER — Ambulatory Visit
Admission: RE | Admit: 2012-01-16 | Discharge: 2012-01-16 | Disposition: A | Discharge status: Home / Self Care | Payer: BC Managed Care – PPO | Source: Ambulatory Visit | Attending: Radiation Oncology | Admitting: Radiation Oncology

## 2012-01-17 ENCOUNTER — Ambulatory Visit
Admission: RE | Admit: 2012-01-17 | Discharge: 2012-01-17 | Disposition: A | Discharge status: Home / Self Care | Payer: BC Managed Care – PPO | Source: Ambulatory Visit | Attending: Radiation Oncology | Admitting: Radiation Oncology

## 2012-01-18 ENCOUNTER — Ambulatory Visit
Admission: RE | Admit: 2012-01-18 | Discharge: 2012-01-18 | Disposition: A | Discharge status: Home / Self Care | Payer: BC Managed Care – PPO | Source: Ambulatory Visit | Attending: Radiation Oncology | Admitting: Radiation Oncology

## 2012-01-18 ENCOUNTER — Encounter: Payer: Self-pay | Admitting: Radiation Oncology

## 2012-01-18 VITALS — BP 139/90 | HR 72 | Temp 97.5°F

## 2012-01-18 DIAGNOSIS — C61 Malignant neoplasm of prostate: Secondary | ICD-10-CM

## 2012-01-18 MED ORDER — PHENAZOPYRIDINE HCL 200 MG PO TABS: 200.0000 mg | ORAL_TABLET | Freq: Three times a day (TID) | ORAL | Status: DC | PRN

## 2012-01-18 NOTE — Progress Notes (Signed)
Mr. Brent Harris in to see physician today with c/o burning and pain in his bladder, as he points to his suprapubic region.  Urine culture from 01/08/12 was negative for infection as ordered by Dr. Roselind Messier.  Dr. Roselind Messier suggested either Pyridium or Advil for relief and Mr. Brent Harris choose to use Advil with relief, but then he states it was not "working as well."  He took a One of his wife's Vicodin last PM with relief, but he wants to be assessed and is actively asking about  Pyridium at this point.  He denies any hematuria.  He states diff. Starting urinary stream in the am, but flow is better after first morning void.  On flomax presently and states he "feel" he is emptying bladder "better".   Nocturia x 4 last pm compared to 6 times nightly prior to initiation of Flomax.

## 2012-01-18 NOTE — Progress Notes (Signed)
   Department of Radiation Oncology  Phone:  [phone] Fax:        [phone]  Weekly Treatment Note    Name: Brent Harris Date: 01/18/2012 MRN: [id number] DOB: 1950/07/21   Current dose: 23.4 Gy  Current fraction: 12   MEDICATIONS: Current Outpatient Prescriptions  Medication Sig Dispense Refill  . cholecalciferol (VITAMIN D) 1000 UNITS tablet Take 1,000 Units by mouth daily.      Marland Kitchen ibuprofen (ADVIL,MOTRIN) 200 MG tablet Take 200 mg by mouth every 6 (six) hours as needed.      Marland Kitchen lisinopril (PRINIVIL,ZESTRIL) 20 MG tablet Take 20 mg by mouth daily.      . Multiple Vitamin (MULTIVITAMIN) tablet Take 1 tablet by mouth daily.      . Tamsulosin HCl (FLOMAX) 0.4 MG CAPS Take 0.4 mg by mouth daily after supper. One tablet by mouth at bedtime; Qty 30 with 2 refills         ALLERGIES: Nutritional supplements and Onion   LABORATORY DATA:  Lab Results  Component Value Date   HGB 14.6 10/09/2011   HCT 43.0 10/09/2011   Lab Results  Component Value Date   NA 143 10/09/2011   K 4.0 10/09/2011   No results found for this basename: ALT, AST, GGT, ALKPHOS, BILITOT     NARRATIVE: Brent Harris was seen today for weekly treatment management. The chart was checked and the patient's films were reviewed. The patient is complaining of ongoing dysuria. He indicates that he has discussed the possible use of per radium with Dr. Roselind Messier. The patient has been using Advil with incomplete relief. He did use a Vicodin from his wife which did help yesterday.  PHYSICAL EXAMINATION: temperature is 97.5 F (36.4 C). His blood pressure is 139/90 and his pulse is 72.        ASSESSMENT: The patient is doing satisfactorily with treatment.  PLAN: We will continue with the patient's radiation treatment as planned. I have written the patient a prescription for Pyridium and we will see how this helps with his dysuria.

## 2012-01-18 NOTE — Progress Notes (Signed)
Called in prescription for Pyridium 200 mg tabs; 1 po TID as needed for pain; total 90 tabs with no refill as ordered by Dr. Mitzi Hansen.  Brent Harris called and informed of prescription.

## 2012-01-21 ENCOUNTER — Ambulatory Visit
Admission: RE | Admit: 2012-01-21 | Discharge: 2012-01-21 | Disposition: A | Discharge status: Home / Self Care | Payer: BC Managed Care – PPO | Source: Ambulatory Visit | Attending: Radiation Oncology | Admitting: Radiation Oncology

## 2012-01-22 ENCOUNTER — Ambulatory Visit
Admission: RE | Admit: 2012-01-22 | Discharge: 2012-01-22 | Disposition: A | Discharge status: Home / Self Care | Payer: BC Managed Care – PPO | Source: Ambulatory Visit | Attending: Radiation Oncology | Admitting: Radiation Oncology

## 2012-01-22 ENCOUNTER — Encounter: Payer: Self-pay | Admitting: Radiation Oncology

## 2012-01-22 VITALS — BP 145/89 | HR 64 | Temp 96.9°F | Resp 20 | Wt 168.3 lb

## 2012-01-22 DIAGNOSIS — C61 Malignant neoplasm of prostate: Secondary | ICD-10-CM

## 2012-01-22 NOTE — Progress Notes (Signed)
[facility]    Radiation Oncology [address] [place]     Maryln Gottron, M.D. [place], [place] [address]               Billie Lade, M.D., Ph.D. Phone: [phone]      Molli Hazard A. Kathrynn Running, M.D. Fax: [phone]      Radene Gunning, M.D., Ph.D.         Lurline Hare, M.D.         Grayland Jack, M.D Weekly Treatment Management Note  Name: Brent Harris     MRN: [id number]        CSN: [id number] Date: 01/22/2012      DOB: Aug 02, 1950  CC: [name], MD         [place]    Status: Outpatient  Diagnosis: The encounter diagnosis was Prostate cancer.  Current Dose: 27.3 Gy   Current Fraction: 14  Planned Dose: 78.0 Gy  Narrative: Brent Harris was seen today for weekly treatment management. The chart was checked and MVCT  were reviewed.  His urinary symptoms are much better after starting Flomax and Pyridium last week.  patient has nocturia approximately 4 times. His baseline was approximately 3 prior to starting radiation therapy. He denies any bowel symptoms at this time.  Nutritional supplements and Onion Current Outpatient Prescriptions  Medication Sig Dispense Refill  . cholecalciferol (VITAMIN D) 1000 UNITS tablet Take 1,000 Units by mouth daily.      Marland Kitchen ibuprofen (ADVIL,MOTRIN) 200 MG tablet Take 200 mg by mouth every 6 (six) hours as needed.      Marland Kitchen lisinopril (PRINIVIL,ZESTRIL) 20 MG tablet Take 20 mg by mouth daily.      . Multiple Vitamin (MULTIVITAMIN) tablet Take 1 tablet by mouth daily.      . phenazopyridine (PYRIDIUM) 200 MG tablet Take 200 mg by mouth 3 (three) times daily as needed. Take 1 tab po TID as needed for pain 90 tabs, No refills      . Tamsulosin HCl (FLOMAX) 0.4 MG CAPS Take 0.4 mg by mouth daily after supper. One tablet by mouth at bedtime; Qty 30 with 2 refills       Labs:  Lab Results  Component Value Date   HGB 14.6 10/09/2011   HCT 43.0 10/09/2011   Lab Results  Component Value Date   NA 143 10/09/2011   K 4.0  10/09/2011   No results found for this basename: ALT, AST, GGT, ALK, PHOS, BILITOT    Physical Examination:  weight is 168 lb 4.8 oz (76.34 kg). His oral temperature is 96.9 F (36.1 C). His blood pressure is 145/89 and his pulse is 64. His respiration is 20.    Wt Readings from Last 3 Encounters:  01/22/12 168 lb 4.8 oz (76.34 kg)  01/14/12 170 lb 1.6 oz (77.157 kg)  01/11/12 167 lb 12.8 oz (76.114 kg)     Lungs - Normal respiratory effort, chest expands symmetrically. Lungs are clear to auscultation, no crackles or wheezes.  Heart has regular rhythm and rate  Abdomen is soft and non tender with normal bowel sounds  Assessment:  Patient tolerating treatments better with medications as above.  Plan: Continue treatment per original radiation prescription    -----------------------------------  Billie Lade, PhD, MD

## 2012-01-22 NOTE — Progress Notes (Signed)
Nocturia x 3-4. Pt on Flomax, taking at night. States Pyridium very helpful w/dysuria; he is taking tid. Denies bowel issues. Pt reports after each radiation tx he has dysuria, urinary pressure, pain, but feels he empties his bladder.

## 2012-01-23 ENCOUNTER — Ambulatory Visit
Admission: RE | Admit: 2012-01-23 | Discharge: 2012-01-23 | Disposition: A | Discharge status: Home / Self Care | Payer: BC Managed Care – PPO | Source: Ambulatory Visit | Attending: Radiation Oncology | Admitting: Radiation Oncology

## 2012-01-24 ENCOUNTER — Ambulatory Visit
Admission: RE | Admit: 2012-01-24 | Discharge: 2012-01-24 | Disposition: A | Discharge status: Home / Self Care | Payer: BC Managed Care – PPO | Source: Ambulatory Visit | Attending: Radiation Oncology | Admitting: Radiation Oncology

## 2012-01-25 ENCOUNTER — Ambulatory Visit
Admission: RE | Admit: 2012-01-25 | Discharge: 2012-01-25 | Disposition: A | Discharge status: Home / Self Care | Payer: BC Managed Care – PPO | Source: Ambulatory Visit | Attending: Radiation Oncology | Admitting: Radiation Oncology

## 2012-01-29 ENCOUNTER — Ambulatory Visit
Admission: RE | Admit: 2012-01-29 | Discharge: 2012-01-29 | Disposition: A | Discharge status: Home / Self Care | Payer: BC Managed Care – PPO | Source: Ambulatory Visit | Attending: Radiation Oncology | Admitting: Radiation Oncology

## 2012-01-29 VITALS — BP 146/93 | HR 65 | Temp 97.4°F | Wt 171.4 lb

## 2012-01-29 DIAGNOSIS — C61 Malignant neoplasm of prostate: Secondary | ICD-10-CM

## 2012-01-29 NOTE — Progress Notes (Signed)
[facility]    Radiation Oncology [address] [place]     Maryln Gottron, M.D. [place], [place] [address]               Billie Lade, M.D., Ph.D. Phone: [phone]      Molli Hazard A. Kathrynn Running, M.D. Fax: [phone]      Radene Gunning, M.D., Ph.D.         Lurline Hare, M.D.         Grayland Jack, M.D Weekly Treatment Management Note  Name: NYZIER BOIVIN     MRN: [id number]        CSN: [id number] Date: 01/29/2012      DOB: [birth year]  CC: [name], MD         [place]    Status: Outpatient  Diagnosis: The encounter diagnosis was Prostate cancer.  Current Dose: 33.15 Gy  Current Fraction: 17  Planned Dose: 78.0 Gy  Narrative: [name] was seen today for weekly treatment management. The chart was checked and MVCT  were reviewed. The patient continues to have improvement in his symptoms with his Flomax and Pyridium. Last week the patient did see his primary care physician and was taken off lisinopril in light of low blood pressure. On exam today the patient's blood pressure however is elevated somewhat. He will come in for additional blood pressure checked later this week at the nurse's station.  Nutritional supplements and Onion Current Outpatient Prescriptions  Medication Sig Dispense Refill  . cholecalciferol (VITAMIN D) 1000 UNITS tablet Take 1,000 Units by mouth daily.      Marland Kitchen ibuprofen (ADVIL,MOTRIN) 200 MG tablet Take 200 mg by mouth every 6 (six) hours as needed.      . Multiple Vitamin (MULTIVITAMIN) tablet Take 1 tablet by mouth daily.      . phenazopyridine (PYRIDIUM) 200 MG tablet Take 200 mg by mouth 3 (three) times daily as needed. Take 1 tab po TID as needed for pain 90 tabs, No refills      . Tamsulosin HCl (FLOMAX) 0.4 MG CAPS Take 0.4 mg by mouth daily after supper. One tablet by mouth at bedtime; Qty 30 with 2 refills      . lisinopril (PRINIVIL,ZESTRIL) 20 MG tablet Take 20 mg by mouth daily.       Labs:  Lab Results    Component Value Date   HGB 14.6 10/09/2011   HCT 43.0 10/09/2011   Lab Results  Component Value Date   NA 143 10/09/2011   K 4.0 10/09/2011   No results found for this basename: ALT, AST, GGT, ALK, PHOS, BILITOT    Physical Examination:  weight is 171 lb 6.4 oz (77.747 kg). His temperature is 97.4 F (36.3 C). His blood pressure is 146/93 and his pulse is 65.    Wt Readings from Last 3 Encounters:  01/29/12 171 lb 6.4 oz (77.747 kg)  01/22/12 168 lb 4.8 oz (76.34 kg)  01/14/12 170 lb 1.6 oz (77.157 kg)     Lungs - Normal respiratory effort, chest expands symmetrically. Lungs are clear to auscultation, no crackles or wheezes.  Heart has regular rhythm and rate  Abdomen is soft and non tender with normal bowel sounds  Assessment:  Patient tolerating treatments better with medications as above.  He does feel he is emptying his bladder well at this time.  Plan: Continue treatment per original radiation prescription   Billie Lade, PhD, MD

## 2012-01-29 NOTE — Progress Notes (Signed)
Here for routine weekly under treat assessment for radiation treatment of prostate.Burning on urination has decreased since starting pyridium.Lisinipril was stopped on Wed. 01/23/12 by PCP. Bowels normal and regular.Frequency with nocturia 4 to 5 times and urgency.Energy level improved.

## 2012-01-30 ENCOUNTER — Ambulatory Visit
Admission: RE | Admit: 2012-01-30 | Discharge: 2012-01-30 | Disposition: A | Discharge status: Home / Self Care | Payer: BC Managed Care – PPO | Source: Ambulatory Visit | Attending: Radiation Oncology | Admitting: Radiation Oncology

## 2012-01-31 ENCOUNTER — Ambulatory Visit
Admission: RE | Admit: 2012-01-31 | Discharge: 2012-01-31 | Disposition: A | Discharge status: Home / Self Care | Payer: BC Managed Care – PPO | Source: Ambulatory Visit | Attending: Radiation Oncology | Admitting: Radiation Oncology

## 2012-02-01 ENCOUNTER — Ambulatory Visit
Admission: RE | Admit: 2012-02-01 | Discharge: 2012-02-01 | Disposition: A | Discharge status: Home / Self Care | Payer: BC Managed Care – PPO | Source: Ambulatory Visit | Attending: Radiation Oncology | Admitting: Radiation Oncology

## 2012-02-04 ENCOUNTER — Ambulatory Visit
Admission: RE | Admit: 2012-02-04 | Discharge: 2012-02-04 | Disposition: A | Discharge status: Home / Self Care | Payer: BC Managed Care – PPO | Source: Ambulatory Visit | Attending: Radiation Oncology | Admitting: Radiation Oncology

## 2012-02-05 ENCOUNTER — Ambulatory Visit
Admission: RE | Admit: 2012-02-05 | Discharge: 2012-02-05 | Disposition: A | Discharge status: Home / Self Care | Payer: BC Managed Care – PPO | Source: Ambulatory Visit | Attending: Radiation Oncology | Admitting: Radiation Oncology

## 2012-02-05 VITALS — BP 144/90 | HR 63 | Temp 97.5°F | Wt 170.7 lb

## 2012-02-05 DIAGNOSIS — Z79899 Other long term (current) drug therapy: Secondary | ICD-10-CM | POA: Insufficient documentation

## 2012-02-05 DIAGNOSIS — C61 Malignant neoplasm of prostate: Secondary | ICD-10-CM | POA: Insufficient documentation

## 2012-02-05 DIAGNOSIS — I10 Essential (primary) hypertension: Secondary | ICD-10-CM | POA: Insufficient documentation

## 2012-02-05 DIAGNOSIS — Z510 Encounter for antineoplastic radiation therapy: Secondary | ICD-10-CM | POA: Insufficient documentation

## 2012-02-05 NOTE — Progress Notes (Signed)
[facility]    Radiation Oncology [address] [place]     Maryln Gottron, M.D. [place], [place] [address]               Billie Lade, M.D., Ph.D. Phone: [phone]      Molli Hazard A. Kathrynn Running, M.D. Fax: [phone]      Radene Gunning, M.D., Ph.D.         Lurline Hare, M.D.         Grayland Jack, M.D Weekly Treatment Management Note  Name: Brent Harris     MRN: [id number]        CSN: [id number] Date: 02/05/2012      DOB: Apr 08, 1951  CC: [name], MD         [place]    Status: Outpatient  Diagnosis: The encounter diagnosis was Prostate cancer.  Current Dose: 44.85 Gy  Current Fraction: 23  Planned Dose: 78.00 Gy  Narrative: [name] was seen today for weekly treatment management. The chart was checked and MVCT  were reviewed. He is comfortable with his Flomax and Pyridium.  Patient had nocturia x3 last night. This is better than his usual baseline nocturia. He denies any bowel problems.  Nutritional supplements and Onion Current Outpatient Prescriptions  Medication Sig Dispense Refill  . cholecalciferol (VITAMIN D) 1000 UNITS tablet Take 1,000 Units by mouth daily.      Marland Kitchen ibuprofen (ADVIL,MOTRIN) 200 MG tablet Take 200 mg by mouth every 6 (six) hours as needed.      Marland Kitchen lisinopril (PRINIVIL,ZESTRIL) 20 MG tablet Take 20 mg by mouth daily.      . Multiple Vitamin (MULTIVITAMIN) tablet Take 1 tablet by mouth daily.      . phenazopyridine (PYRIDIUM) 200 MG tablet Take 200 mg by mouth 3 (three) times daily as needed. Take 1 tab po TID as needed for pain 90 tabs, No refills      . Tamsulosin HCl (FLOMAX) 0.4 MG CAPS Take 0.4 mg by mouth daily after supper. One tablet by mouth at bedtime; Qty 30 with 2 refills       Labs:  Lab Results  Component Value Date   HGB 14.6 10/09/2011   HCT 43.0 10/09/2011   Lab Results  Component Value Date   NA 143 10/09/2011   K 4.0 10/09/2011   No results found for this basename: ALT, AST, GGT, ALK, PHOS,  BILITOT    Physical Examination:  weight is 170 lb 11.2 oz (77.429 kg). His temperature is 97.5 F (36.4 C). His blood pressure is 144/90 and his pulse is 63.    Wt Readings from Last 3 Encounters:  02/05/12 170 lb 11.2 oz (77.429 kg)  01/29/12 171 lb 6.4 oz (77.747 kg)  01/22/12 168 lb 4.8 oz (76.34 kg)     Lungs - Normal respiratory effort, chest expands symmetrically. Lungs are clear to auscultation, no crackles or wheezes.  Heart has regular rhythm and rate  Abdomen is soft and non tender with normal bowel sounds  Assessment:  Patient tolerating treatments well  Plan: Continue treatment per original radiation prescription    -----------------------------------  Billie Lade, PhD, MD

## 2012-02-05 NOTE — Progress Notes (Signed)
Here for weekly md asssessment of radiation treatment of prostate.Completed 23 of 40 treatments.Denies pain.Urine flow good on flomax and pyridium.Denies burning.Bowels soft. Increased flatus.Urgency only in am.Mild fatigue later in day.

## 2012-02-06 ENCOUNTER — Ambulatory Visit
Admission: RE | Admit: 2012-02-06 | Discharge: 2012-02-06 | Disposition: A | Discharge status: Home / Self Care | Payer: BC Managed Care – PPO | Source: Ambulatory Visit | Attending: Radiation Oncology | Admitting: Radiation Oncology

## 2012-02-07 ENCOUNTER — Ambulatory Visit
Admission: RE | Admit: 2012-02-07 | Discharge: 2012-02-07 | Disposition: A | Discharge status: Home / Self Care | Payer: BC Managed Care – PPO | Source: Ambulatory Visit | Attending: Radiation Oncology | Admitting: Radiation Oncology

## 2012-02-08 ENCOUNTER — Ambulatory Visit
Admission: RE | Admit: 2012-02-08 | Discharge: 2012-02-08 | Disposition: A | Discharge status: Home / Self Care | Payer: BC Managed Care – PPO | Source: Ambulatory Visit | Attending: Radiation Oncology | Admitting: Radiation Oncology

## 2012-02-08 ENCOUNTER — Ambulatory Visit: Admission: RE | Admit: 2012-02-08 | Payer: BC Managed Care – PPO | Source: Ambulatory Visit

## 2012-02-11 ENCOUNTER — Ambulatory Visit
Admission: RE | Admit: 2012-02-11 | Discharge: 2012-02-11 | Disposition: A | Discharge status: Home / Self Care | Payer: BC Managed Care – PPO | Source: Ambulatory Visit | Attending: Radiation Oncology | Admitting: Radiation Oncology

## 2012-02-12 ENCOUNTER — Ambulatory Visit
Admission: RE | Admit: 2012-02-12 | Discharge: 2012-02-12 | Disposition: A | Discharge status: Home / Self Care | Payer: BC Managed Care – PPO | Source: Ambulatory Visit | Attending: Radiation Oncology | Admitting: Radiation Oncology

## 2012-02-12 VITALS — BP 139/88 | HR 78 | Temp 98.4°F | Wt 167.8 lb

## 2012-02-12 DIAGNOSIS — C61 Malignant neoplasm of prostate: Secondary | ICD-10-CM

## 2012-02-12 NOTE — Progress Notes (Signed)
[facility]    Radiation Oncology [address] [place]     Maryln Gottron, M.D. [place], [place] [address]               Billie Lade, M.D., Ph.D. Phone: [phone]      Molli Hazard A. Kathrynn Running, M.D. Fax: [phone]      Radene Gunning, M.D., Ph.D.         Lurline Hare, M.D.         Grayland Jack, M.D Weekly Treatment Management Note  Name: Brent Harris     MRN: [id number]        CSN: [id number] Date: 02/12/2012      DOB: 1950/10/28  CC: [name], MD         [place]    Status: Outpatient  Diagnosis: The encounter diagnosis was Prostate cancer.  Current Dose: 5460 cGy  Current Fraction: 28  Planned Dose: 7800 cGy  Narrative: Brent Harris was seen today for weekly treatment management. The chart was checked and MVCT  were reviewed. He continues to tolerate his treatments well with the addition of Pyridium and Flomax.  He has increased bowel frequency but no diarrhea or rectal bleeding.  He is going back to the gym for regular exercise at this time.  Nutritional supplements and Onion Current Outpatient Prescriptions  Medication Sig Dispense Refill  . cholecalciferol (VITAMIN D) 1000 UNITS tablet Take 1,000 Units by mouth daily.      Marland Kitchen ibuprofen (ADVIL,MOTRIN) 200 MG tablet Take 200 mg by mouth every 6 (six) hours as needed.      Marland Kitchen lisinopril (PRINIVIL,ZESTRIL) 20 MG tablet Take 20 mg by mouth daily.      . Multiple Vitamin (MULTIVITAMIN) tablet Take 1 tablet by mouth daily.      . phenazopyridine (PYRIDIUM) 200 MG tablet Take 200 mg by mouth 3 (three) times daily as needed. Take 1 tab po TID as needed for pain 90 tabs, No refills      . Tamsulosin HCl (FLOMAX) 0.4 MG CAPS Take 0.4 mg by mouth daily after supper. One tablet by mouth at bedtime; Qty 30 with 2 refills       Labs:  Lab Results  Component Value Date   HGB 14.6 10/09/2011   HCT 43.0 10/09/2011   Lab Results  Component Value Date   NA 143 10/09/2011   K 4.0 10/09/2011   No  results found for this basename: ALT, AST, GGT, ALK, PHOS, BILITOT    Physical Examination:  weight is 167 lb 12.8 oz (76.114 kg). His temperature is 98.4 F (36.9 C). His blood pressure is 139/88 and his pulse is 78.    Wt Readings from Last 3 Encounters:  02/12/12 167 lb 12.8 oz (76.114 kg)  02/05/12 170 lb 11.2 oz (77.429 kg)  01/29/12 171 lb 6.4 oz (77.747 kg)     Lungs - Normal respiratory effort, chest expands symmetrically. Lungs are clear to auscultation, no crackles or wheezes.  Heart has regular rhythm and rate  Abdomen is soft and non tender with normal bowel sounds  Assessment:  Patient tolerating treatments well  Plan: Continue treatment per original radiation prescription to 7800 cGy.  -----------------------------------  Billie Lade, PhD, MD

## 2012-02-12 NOTE — Progress Notes (Signed)
Patient here for weekly assessment of prostate cancer.Awaiting treatment number 28 of 40 after doctor visit. Denies pain.Urninary frequency. No burning or any other concerns today.

## 2012-02-13 ENCOUNTER — Ambulatory Visit
Admission: RE | Admit: 2012-02-13 | Discharge: 2012-02-13 | Disposition: A | Discharge status: Home / Self Care | Payer: BC Managed Care – PPO | Source: Ambulatory Visit | Attending: Radiation Oncology | Admitting: Radiation Oncology

## 2012-02-14 ENCOUNTER — Ambulatory Visit
Admission: RE | Admit: 2012-02-14 | Discharge: 2012-02-14 | Disposition: A | Discharge status: Home / Self Care | Payer: BC Managed Care – PPO | Source: Ambulatory Visit | Attending: Radiation Oncology | Admitting: Radiation Oncology

## 2012-02-15 ENCOUNTER — Ambulatory Visit
Admission: RE | Admit: 2012-02-15 | Discharge: 2012-02-15 | Disposition: A | Discharge status: Home / Self Care | Payer: BC Managed Care – PPO | Source: Ambulatory Visit | Attending: Radiation Oncology | Admitting: Radiation Oncology

## 2012-02-18 ENCOUNTER — Ambulatory Visit
Admission: RE | Admit: 2012-02-18 | Discharge: 2012-02-18 | Disposition: A | Discharge status: Home / Self Care | Payer: BC Managed Care – PPO | Source: Ambulatory Visit | Attending: Radiation Oncology | Admitting: Radiation Oncology

## 2012-02-18 MED ORDER — PHENAZOPYRIDINE HCL 200 MG PO TABS: 200.0000 mg | ORAL_TABLET | Freq: Three times a day (TID) | ORAL | Status: DC | PRN

## 2012-02-19 ENCOUNTER — Ambulatory Visit
Admission: RE | Admit: 2012-02-19 | Discharge: 2012-02-19 | Disposition: A | Discharge status: Home / Self Care | Payer: BC Managed Care – PPO | Source: Ambulatory Visit | Attending: Radiation Oncology | Admitting: Radiation Oncology

## 2012-02-19 VITALS — BP 136/97 | HR 75 | Temp 97.7°F | Wt 167.7 lb

## 2012-02-19 DIAGNOSIS — C61 Malignant neoplasm of prostate: Secondary | ICD-10-CM

## 2012-02-19 NOTE — Progress Notes (Signed)
Patient here for routine weekly assessment.Doing well.Getting over cold with cough.Able to continue to work out.Denies pain.Frequency and urgency of urination but no burning.Bowels normal.Generlized fatigue.

## 2012-02-19 NOTE — Progress Notes (Signed)
[facility]    Radiation Oncology [address] [place]     Maryln Gottron, M.D. [place], [place] [address]               Billie Lade, M.D., Ph.D. Phone: [phone]      Molli Hazard A. Kathrynn Running, M.D. Fax: [phone]      Radene Gunning, M.D., Ph.D.         Lurline Hare, M.D.         Grayland Jack, M.D Weekly Treatment Management Note  Name: Brent Harris     MRN: [id number]        CSN: [id number] Date: 02/19/2012      DOB: 1951/01/22  CC: Brent Moh, MD         [place]    Status: Outpatient  Diagnosis: The encounter diagnosis was Prostate cancer.  Current Dose: 6435 cGy  Current Fraction: 33/40  Planned Dose: 7800 cGy  Narrative: Brent Harris was seen today for weekly treatment management. The chart was checked and MVCT  were reviewed. He is doing well this week. His urinary symptoms are controlled well with Pyridium and Flomax. He did have a chest cold over the weekend and has recovered from this issue. No bowel problems  Nutritional supplements and Onion Current Outpatient Prescriptions  Medication Sig Dispense Refill  . cholecalciferol (VITAMIN D) 1000 UNITS tablet Take 1,000 Units by mouth daily.      Marland Kitchen ibuprofen (ADVIL,MOTRIN) 200 MG tablet Take 200 mg by mouth every 6 (six) hours as needed.      Marland Kitchen lisinopril (PRINIVIL,ZESTRIL) 20 MG tablet Take 20 mg by mouth daily.      . Multiple Vitamin (MULTIVITAMIN) tablet Take 1 tablet by mouth daily.      . phenazopyridine (PYRIDIUM) 200 MG tablet Take 1 tablet (200 mg total) by mouth 3 (three) times daily as needed. Take 1 tab po TID as needed for pain 90 tabs, No refills  90 tablet  1  . Tamsulosin HCl (FLOMAX) 0.4 MG CAPS Take 0.4 mg by mouth daily after supper. One tablet by mouth at bedtime; Qty 30 with 2 refills       Labs:  Lab Results  Component Value Date   HGB 14.6 10/09/2011   HCT 43.0 10/09/2011   Lab Results  Component Value Date   NA 143 10/09/2011   K 4.0 10/09/2011   No  results found for this basename: ALT, AST, GGT, ALK, PHOS, BILITOT    Physical Examination:  weight is 167 lb 11.2 oz (76.068 kg). His temperature is 97.7 F (36.5 C). His blood pressure is 136/97 and his pulse is 75. His oxygen saturation is 96%.    Wt Readings from Last 3 Encounters:  02/19/12 167 lb 11.2 oz (76.068 kg)  02/12/12 167 lb 12.8 oz (76.114 kg)  02/05/12 170 lb 11.2 oz (77.429 kg)     Lungs - Normal respiratory effort, chest expands symmetrically. Lungs are clear to auscultation, no crackles or wheezes.  Heart has regular rhythm and rate  Abdomen is soft and non tender with normal bowel sounds  Assessment:  Patient tolerating treatments well  Plan: Continue treatment per original radiation prescription    -----------------------------------  Billie Lade, PhD, MD

## 2012-02-20 ENCOUNTER — Ambulatory Visit
Admission: RE | Admit: 2012-02-20 | Discharge: 2012-02-20 | Disposition: A | Discharge status: Home / Self Care | Payer: BC Managed Care – PPO | Source: Ambulatory Visit | Attending: Radiation Oncology | Admitting: Radiation Oncology

## 2012-02-21 ENCOUNTER — Ambulatory Visit: Payer: BC Managed Care – PPO

## 2012-02-22 ENCOUNTER — Ambulatory Visit
Admission: RE | Admit: 2012-02-22 | Discharge: 2012-02-22 | Disposition: A | Discharge status: Home / Self Care | Payer: BC Managed Care – PPO | Source: Ambulatory Visit | Attending: Radiation Oncology | Admitting: Radiation Oncology

## 2012-02-25 ENCOUNTER — Ambulatory Visit
Admission: RE | Admit: 2012-02-25 | Discharge: 2012-02-25 | Disposition: A | Discharge status: Home / Self Care | Payer: BC Managed Care – PPO | Source: Ambulatory Visit | Attending: Radiation Oncology | Admitting: Radiation Oncology

## 2012-02-26 ENCOUNTER — Ambulatory Visit
Admission: RE | Admit: 2012-02-26 | Discharge: 2012-02-26 | Disposition: A | Discharge status: Home / Self Care | Payer: BC Managed Care – PPO | Source: Ambulatory Visit | Attending: Radiation Oncology | Admitting: Radiation Oncology

## 2012-02-26 VITALS — BP 129/91 | HR 91 | Temp 97.8°F | Wt 169.0 lb

## 2012-02-26 DIAGNOSIS — C61 Malignant neoplasm of prostate: Secondary | ICD-10-CM

## 2012-02-26 NOTE — Progress Notes (Signed)
Patient here for routine weekly under treat visit for prostate cancer radiation.Has increased fatigue but still able to work out.Has some rectal burning on defacation at times. Urine flow remains the same.Has completed 37 of 40 treatments.

## 2012-02-26 NOTE — Progress Notes (Signed)
[facility]    Radiation Oncology [address] [place]     Maryln Gottron, M.D. [place], [place] [address]               Billie Lade, M.D., Ph.D. Phone: [phone]      Molli Hazard A. Kathrynn Running, M.D. Fax: [phone]      Radene Gunning, M.D., Ph.D.         Lurline Hare, M.D.         Grayland Jack, M.D Weekly Treatment Management Note  Name: Brent Harris     MRN: [id number]        CSN: [id number] Date: 02/26/2012      DOB: May 09, 1951  CC: [name], MD         [place]    Status: Outpatient  Diagnosis: The encounter diagnosis was Prostate cancer.  Current Dose: 7215  Current Fraction: 37/40  Planned Dose: 7800 cGy  Narrative: Brent Harris was seen today for weekly treatment management. The chart was checked and MVCT  were reviewed. He is doing well this week. He is not requiring his Pyridium on regular basis and only takes at night.  He has had some mild rectal discomfort with bowel movements. He denies the rectal bleeding or problems with diarrhea.  He has noticed some fatigue but still continues to go to the gym for workouts.  Nutritional supplements and Onion Current Outpatient Prescriptions  Medication Sig Dispense Refill  . cholecalciferol (VITAMIN D) 1000 UNITS tablet Take 1,000 Units by mouth daily.      Marland Kitchen ibuprofen (ADVIL,MOTRIN) 200 MG tablet Take 200 mg by mouth every 6 (six) hours as needed.      Marland Kitchen lisinopril (PRINIVIL,ZESTRIL) 20 MG tablet Take 20 mg by mouth daily.      . Multiple Vitamin (MULTIVITAMIN) tablet Take 1 tablet by mouth daily.      . phenazopyridine (PYRIDIUM) 200 MG tablet Take 1 tablet (200 mg total) by mouth 3 (three) times daily as needed. Take 1 tab po TID as needed for pain 90 tabs, No refills  90 tablet  1  . Tamsulosin HCl (FLOMAX) 0.4 MG CAPS Take 0.4 mg by mouth daily after supper. One tablet by mouth at bedtime; Qty 30 with 2 refills       Labs:  Lab Results  Component Value Date   HGB 14.6 10/09/2011   HCT 43.0 10/09/2011   Lab Results  Component Value Date   NA 143 10/09/2011   K 4.0 10/09/2011   No results found for this basename: ALT, AST, GGT, ALK, PHOS, BILITOT    Physical Examination:  weight is 169 lb (76.658 kg). His temperature is 97.8 F (36.6 C). His blood pressure is 129/91 and his pulse is 91.    Wt Readings from Last 3 Encounters:  02/26/12 169 lb (76.658 kg)  02/19/12 167 lb 11.2 oz (76.068 kg)  02/12/12 167 lb 12.8 oz (76.114 kg)     Lungs - Normal respiratory effort, chest expands symmetrically. Lungs are clear to auscultation, no crackles or wheezes.  Heart has regular rhythm and rate  Abdomen is soft and non tender with normal bowel sounds  Assessment:  Patient tolerating treatments well  Plan: Continue treatment per original radiation prescription to 7800 cGy.  -----------------------------------  Billie Lade, PhD, MD

## 2012-02-27 ENCOUNTER — Ambulatory Visit
Admission: RE | Admit: 2012-02-27 | Discharge: 2012-02-27 | Disposition: A | Discharge status: Home / Self Care | Payer: BC Managed Care – PPO | Source: Ambulatory Visit | Attending: Radiation Oncology | Admitting: Radiation Oncology

## 2012-02-28 ENCOUNTER — Ambulatory Visit
Admission: RE | Admit: 2012-02-28 | Discharge: 2012-02-28 | Disposition: A | Discharge status: Home / Self Care | Payer: BC Managed Care – PPO | Source: Ambulatory Visit | Attending: Radiation Oncology | Admitting: Radiation Oncology

## 2012-02-29 ENCOUNTER — Ambulatory Visit
Admission: RE | Admit: 2012-02-29 | Discharge: 2012-02-29 | Disposition: A | Discharge status: Home / Self Care | Payer: BC Managed Care – PPO | Source: Ambulatory Visit | Attending: Radiation Oncology | Admitting: Radiation Oncology

## 2012-02-29 DIAGNOSIS — C61 Malignant neoplasm of prostate: Secondary | ICD-10-CM

## 2012-03-03 NOTE — Progress Notes (Signed)
  Radiation Oncology         [phone] ________________________________  Name: Brent Harris MRN: [id number]  Date: 02/29/2012  DOB: Oct 08, 1950  End of Treatment Note  Diagnosis:   Stage T1-C Gleason's 6 adenocarcinoma prostate     Indication for treatment:  Definitive treatment       Radiation treatment dates:   01/03/2012 through 02/29/2012  Site/dose:   Prostate 7800 cGy in 40 fractions  Beams/energy:   Helical intensity modulated radiation therapy using 6 MV photons  Narrative: The patient tolerated radiation treatment relatively well.   Early on during his treatment he did develop problems with urinary urgency,  frequency and dysuria. He was placed on Flomax and Pyridium which helped his symptoms significantly.  He had minimal fatigue and bowel complaints during the course of his treatment.  Plan: The patient has completed radiation treatment. The patient will return to radiation oncology clinic for routine followup in one month. I advised them to call or return sooner if they have any questions or concerns related to their recovery or treatment.  -----------------------------------  Billie Lade, PhD, MD

## 2012-03-31 ENCOUNTER — Encounter: Payer: Self-pay | Admitting: Radiation Oncology

## 2012-03-31 ENCOUNTER — Ambulatory Visit
Admission: RE | Admit: 2012-03-31 | Discharge: 2012-03-31 | Disposition: A | Discharge status: Home / Self Care | Payer: BC Managed Care – PPO | Source: Ambulatory Visit | Attending: Radiation Oncology | Admitting: Radiation Oncology

## 2012-03-31 VITALS — BP 133/92 | HR 77 | Temp 98.3°F | Wt 168.1 lb

## 2012-03-31 DIAGNOSIS — C61 Malignant neoplasm of prostate: Secondary | ICD-10-CM

## 2012-03-31 NOTE — Patient Instructions (Signed)
Routine followup in 6 months  Followup with urology in 2 months

## 2012-03-31 NOTE — Progress Notes (Signed)
Patient here for one month follow up completion of prostate cancer treatment.Stopped taking flomax  On 03/30/12, also stopped pyridium.No pain or burning on urination.Wil check with PCP about resuming lisinipril.Intermittent fatigue.Does work out twice weekly.Bowels with in normal limits.

## 2012-03-31 NOTE — Progress Notes (Signed)
  Radiation Oncology         [phone] ________________________________  Name: Brent Harris MRN: [id number]  Date: 03/31/2012  DOB: [birth year]  Follow-Up Visit Note  CC: [name], MD  Renne Crigler Maryagnes Amos,*  Diagnosis:   Stage TIC Gleason's 6 adenocarcinoma prostate  Interval Since Last Radiation:  One month   Narrative:  The patient returns today for routine follow-up.  He seems to be doing well at this time. Patient has stopped taking his pyridium and Flomax.  He denies any dysuria or hematuria. He has nocturia approximately one to 2 times.  He denies any bowel complaints. He continues to have some mild fatigue.                              ALLERGIES:  is allergic to nutritional supplements and onion.  Meds: Current Outpatient Prescriptions  Medication Sig Dispense Refill  . cholecalciferol (VITAMIN D) 1000 UNITS tablet Take 1,000 Units by mouth daily.      Marland Kitchen ibuprofen (ADVIL,MOTRIN) 200 MG tablet Take 200 mg by mouth every 6 (six) hours as needed.      . Multiple Vitamin (MULTIVITAMIN) tablet Take 1 tablet by mouth daily.      Marland Kitchen lisinopril (PRINIVIL,ZESTRIL) 20 MG tablet Take 20 mg by mouth daily.      . phenazopyridine (PYRIDIUM) 200 MG tablet Take 1 tablet (200 mg total) by mouth 3 (three) times daily as needed. Take 1 tab po TID as needed for pain 90 tabs, No refills  90 tablet  1  . Tamsulosin HCl (FLOMAX) 0.4 MG CAPS Take 0.4 mg by mouth daily after supper. One tablet by mouth at bedtime; Qty 30 with 2 refills        Physical Findings: The patient is in no acute distress. Patient is alert and oriented.  weight is 168 lb 1.6 oz (76.25 kg). His temperature is 98.3 F (36.8 C). His blood pressure is 133/92 and his pulse is 77. Marland Kitchen  No palpable cervical supraclavicular or axillary adenopathy. The lungs are clear to auscultation. The heart has regular rhythm and rate.  The abdomen is soft and nontender with normal bowel sounds. A prostate exam is not performed  in light of the patient's recent completion of treatment.  Lab Findings: Lab Results  Component Value Date   HGB 14.6 10/09/2011   HCT 43.0 10/09/2011    @LASTCHEM @  Radiographic Findings: No results found.  Impression:  The patient is recovering from the effects of radiation.    Plan:  Routine followup in 6 months. In the interim the patient will see Dr. Bjorn Pippin for exam and PSA blood test.  _____________________________________    Billie Lade, PhD, MD

## 2012-09-26 ENCOUNTER — Encounter: Payer: Self-pay | Admitting: Radiation Oncology

## 2012-09-29 ENCOUNTER — Ambulatory Visit
Admission: RE | Admit: 2012-09-29 | Discharge: 2012-09-29 | Disposition: A | Discharge status: Home / Self Care | Payer: BC Managed Care – PPO | Source: Ambulatory Visit | Attending: Radiation Oncology | Admitting: Radiation Oncology

## 2012-09-29 ENCOUNTER — Encounter: Payer: Self-pay | Admitting: Radiation Oncology

## 2012-09-29 VITALS — BP 118/87 | HR 75 | Temp 97.8°F | Resp 20 | Wt 165.9 lb

## 2012-09-29 DIAGNOSIS — C61 Malignant neoplasm of prostate: Secondary | ICD-10-CM

## 2012-09-29 NOTE — Progress Notes (Signed)
Radiation Oncology         [phone] ________________________________  Name: Brent Harris MRN: [id number]  Date: 09/29/2012  DOB: 1951/02/12  Follow-Up Visit Note  CC: [name], MD  Anner Crete, MD  Diagnosis:  Stage TI C. Gleason's 6 prostate cancer  Interval Since Last Radiation:  7  months  Narrative:  The patient returns today for routine follow-up.  He seems to be doing reasonably well. He however did develop an episode of discomfort with intercourse and ejaculation as well as some dysuria afterwards. The patient denied any blood in semen or  hematuria. Patient was seen by Dr. Belva Crome PA and urine was performed without any obvious infection.  The patient was given Uribel which did help with his dysuria. This problem cleared up completely approximately a week later. Last month the patient's PSA had dropped down to 5.91.                                                                                                                                                                                            is allergic to nutritional supplements and onion.  Meds: Current Outpatient Prescriptions  Medication Sig Dispense Refill  . cholecalciferol (VITAMIN D) 1000 UNITS tablet Take 1,000 Units by mouth daily.      Marland Kitchen ibuprofen (ADVIL,MOTRIN) 200 MG tablet Take 200 mg by mouth every 6 (six) hours as needed.      Marland Kitchen lisinopril (PRINIVIL,ZESTRIL) 20 MG tablet Take 20 mg by mouth daily.      . Multiple Vitamin (MULTIVITAMIN) tablet Take 1 tablet by mouth daily.       No current facility-administered medications for this encounter.    Physical Findings: The patient is in no acute distress. Patient is alert and oriented.  weight is 165 lb 13.9 oz (75.238 kg). His oral temperature is 97.8 F (36.6 C). His blood pressure is 118/87 and his pulse is 75. His respiration is 20. Marland Kitchen  No palpable supraclavicular or axillary adenopathy. Lungs are clear to auscultation. The heart has a  regular rhythm and rate. Abdomen is soft and nontender with normal bowel sounds. A rectal exam is not performed in light of anticipated exam by urology.  Lab Findings: Lab Results  Component Value Date   HGB 14.6 10/09/2011   HCT 43.0 10/09/2011      Radiographic Findings: No results found.  Impression:  The patient is recovering from the effects of radiation.  The patient's PSA is starting to drop at this time.  Plan:  When necessary followup in radiation oncology. The patient will continue close followup in urology.  _____________________________________  -----------------------------------  Billie Lade, PhD, MD

## 2012-09-29 NOTE — Progress Notes (Signed)
Pt denies pain, loss of appetite, fatigue, urinary/bowel issues. He has nocturia x 2-3. He states that over past 2 weeks he has developed pain following sexual intimacy. He states he went to urologist for this, will discuss w/Dr Roselind Messier today.

## 2017-05-30 ENCOUNTER — Other Ambulatory Visit: Payer: Self-pay

## 2017-05-30 ENCOUNTER — Encounter (HOSPITAL_COMMUNITY): Payer: Self-pay | Admitting: *Deleted

## 2017-05-30 ENCOUNTER — Other Ambulatory Visit: Payer: Self-pay | Admitting: Urology

## 2017-06-02 NOTE — H&P (Signed)
Office Visit Report     05/17/2017   --------------------------------------------------------------------------------   Brent Harris  MRN: [id number]  PRIMARY CARE:  Deland Pretty, MD  DOB: October 28, 1950, 67 year old Male  REFERRING:    SSN: [id number]  PROVIDER:  [name], M.D.    TREATING:  Azucena Fallen    LOCATION:  [facility] [phone]   --------------------------------------------------------------------------------   CC: I have ureteral stone.  HPI: Brent Harris is a 67 year-old male established patient who is here for ureteral stone.  04/16/17: Brent Harris returns today in f/u with his CT results done for recent hematuria. He was found to have a 26mm right proximal stone. He has had no pain and no further gross hematuria. He has some frequency with increased fluid intake. He has had prior stones.   05/17/17: He returns today for follow-up with KUB and renal ultrasound to assess for progression of 5 mm right proximal ureteral stone. Today, he states that he continues to feel well. He denies any current flank pain or abdominal pain. He denies any exacerbation of voiding symptoms. No gross hematuria or fevers. He denies seeing a stone pass.   The problem is on the right side. He is not currently having flank pain, back pain, groin pain, nausea, vomiting, fever or chills. He has not caught a stone in his urine strainer since his symptoms began.     ALLERGIES: Tamsulosin HCl CAPS    MEDICATIONS: Calcium Magnesium 750 TABS Oral  Daily Vitamin tablet Oral  Flomax 0.4 mg capsule  Lisinopril 30 mg tablet Oral  Red Yeast Rice Powder Does Not Apply  Saw Palmetto TABS Oral  Sildenafil 20 mg tablet 0 Oral  Vitamin D3 1000 UNIT Oral Capsule Oral     GU PSH: Locm 300-399Mg /Ml Iodine,1Ml - 04/04/2017 Prostate Needle Biopsy - 2013, 2008      [place] Notes: Biopsy Of The Prostate Needle, Colonoscopy (Fiberoptic), Biopsy Of The Prostate Needle, Complete Colonoscopy   NON-GU PSH:  Diagnostic Colonoscopy - 2010, 2008    GU PMH: Ureteral calculus, He has a 66mm right proximal stone with recent hematuria but is asymptomatic now. Since the stone appears to still be at L3, I am going to have him return in a month with a KUB and renal US. If there is no progression at that time we will need to consider treatment. - 04/16/2017 Gross hematuria, He had an episode of painless gross hematuria. I will get him set up for a CT and f/u for cystoscopy. - 03/27/2017 History of prostate cancer (Worsening), His PSA is down to 0.61. He will need that repeated in a year. - 03/27/2017, (Improving), His PSA continues to fall and his voiding complaints have improved. He will return in 6 months with a PSA for an exam. , - 09/12/2016 (Improving), - 03/19/2016, History of prostate cancer, - 2017 ED due to arterial insufficiency, Erectile dysfunction due to arterial insufficiency - 2017 Nocturia, Nocturia - 2017 Prostate Cancer, Prostate cancer - 2016 Dysuria, Dysuria - 2015 Elevated PSA, Elevated prostate specific antigen (PSA) - 2015 Personal Hx Oth male genital organs diseases, History of prostatitis - 2015, History of chronic prostatitis, - 2014 BPH w/o LUTS, Benign prostatic hypertrophy without lower urinary tract symptoms - 2015 History of urolithiasis, Nephrolithiasis - 2014    NON-GU PMH: Encounter for general adult medical examination without abnormal findings, Encounter for preventive health examination - 2017 Personal history of other diseases of the circulatory system, History of hypertension - 2014  Personal history of other endocrine, nutritional and metabolic disease, History of hypercholesterolemia - 2014    FAMILY HISTORY: Breast Cancer - Mother Glaucoma - Father Prostate Cancer - Father   SOCIAL HISTORY: Marital Status: Married Preferred Language: English; Ethnicity: Not Hispanic Or Latino; Race: White     Notes: Retired, Alcohol Use, Never A Smoker, Tobacco Use, Marital  History - Currently Married, Occupation:, Caffeine Use   REVIEW OF SYSTEMS:    GU Review Male:   Patient reports get up at night to urinate. Patient denies frequent urination, hard to postpone urination, burning/ pain with urination, leakage of urine, stream starts and stops, trouble starting your stream, have to strain to urinate , erection problems, and penile pain.  Gastrointestinal (Upper):   Patient denies nausea, vomiting, and indigestion/ heartburn.  Gastrointestinal (Lower):   Patient denies diarrhea and constipation.  Constitutional:   Patient denies fever, night sweats, weight loss, and fatigue.  Skin:   Patient denies skin rash/ lesion and itching.  Eyes:   Patient denies blurred vision and double vision.  Ears/ Nose/ Throat:   Patient denies sore throat and sinus problems.  Hematologic/Lymphatic:   Patient denies swollen glands and easy bruising.  Cardiovascular:   Patient denies leg swelling and chest pains.  Respiratory:   Patient denies cough and shortness of breath.  Endocrine:   Patient denies excessive thirst.  Musculoskeletal:   Patient denies back pain and joint pain.  Neurological:   Patient denies headaches and dizziness.  Psychologic:   Patient denies depression and anxiety.   VITAL SIGNS:      05/17/2017 10:41 AM  BP 127/85 mmHg  Pulse 71 /min  Temperature 97.5 F / 36.3 C   MULTI-SYSTEM PHYSICAL EXAMINATION:    Constitutional: Well-nourished. No physical deformities. Normally developed. Good grooming.  Respiratory: Normal breath sounds. No labored breathing, no use of accessory muscles.   Cardiovascular: Regular rate and rhythm. No murmur, no gallop. Normal temperature, no swelling, no varicosities.   Skin: No paleness, no jaundice, no cyanosis. No lesion, no ulcer, no rash.  Neurologic / Psychiatric: Oriented to time, oriented to place, oriented to person. No depression, no anxiety, no agitation.  Gastrointestinal: No mass, no tenderness, no rigidity, non obese  abdomen.  Musculoskeletal: Spine, ribs, pelvis no bilateral tenderness. Normal gait and station of head and neck.     PAST DATA REVIEWED:  Source Of History:  Patient  Records Review:   Previous Patient Records  X-Ray Review: KUB: Reviewed Films.  C.T. Stone Protocol: Reviewed Films. Reviewed Report.     03/20/17 09/10/16 03/12/16 09/13/15 06/14/15 12/10/14 06/09/14 12/03/13  PSA  Total PSA 0.61 ng/mL 0.72 ng/dl 0.92 ng/dl 1.37  2.01  1.39  2.07  2.70     PROCEDURES:         KUB - 74018  A single view of the abdomen is obtained. No obvious opacities noted within the confines of bilateral renal shadows or long expected anatomical tract in the left ureter. There continues to be in an opacity noted along the expected anatomical tract of the right ureter at the level of L4 approximately. This does appear consistent with stone noted on prior imaging. However, there is prominent overlying bowel gas in this area as well. Stable pelvic phleboliths noted. No new bony abnormalities noted.               Renal Ultrasound - 73532  Right Kidney: Length: 10.7 cm Depth: 4.6 cm Cortical Width: 1.6 cm Width: 5.0 cm  Left Kidney: Length: 10.6 cm Depth: 5.2 cm Cortical Width: 1.9 cm Width: 5.4 cm  Left Kidney/Ureter:  Cystic area UP= 1.2x1.1x0.91cm  Right Kidney/Ureter:  Appears WNLs  Bladder:  PVR= 44.99               Urinalysis Dipstick Dipstick Cont'd  Color: Yellow Bilirubin: Neg  Appearance: Clear Ketones: Neg  Specific Gravity: 1.015 Blood: Neg  pH: 7.0 Protein: Neg  Glucose: Neg Urobilinogen: 0.2    Nitrites: Neg    Leukocyte Esterase: Neg    ASSESSMENT:      ICD-10 Details  1 GU:   Ureteral calculus - N20.1    PLAN:           Orders Labs Urine Culture          Schedule Return Visit/Planned Activity: Other See Visit Notes          Document Letter(s):  Created for Patient: Clinical Summary         Notes:   Urinalysis today is clear, but I will send for urine  culture. KUB today still shows approximately 5 mm stone along the expected anatomical tract of his right ureter. Renal ultrasound is reassuring without significant hydronephrosis. We discussed treatment options in detail today. He would like to move forward with scheduling the procedure given minimal progression of his stone. We discussed ESWL and ureteroscopy in detail today. I will discuss case and review images with Dr. Jeffie Pollock and inform him of these recommendations. I will then post him for a procedure accordingly. I encouraged that he continue to hydrate well. Return precautions discussed in the interim and he voiced understanding.     ** Signed by Azucena Fallen on 05/17/17 at 12:48 PM (EST)**       APPENDED NOTES:  Reviwed images with Dr. Jeffie Pollock. I will proceed with posting for right ESWL. Discussed indications and risks in detail with him today. I described the risks which include kidney contusion, kidney hemorrhage, need for transfusion, back discomfort, flank ecchymosis, flank abrasion, inability to break up stone, inability to pass stone fragments, Steinstrasse, infection associated with obstructing stones, need for different surgical procedure and possible need for repeat shockwave lithotripsy. He voiced understanding.      ** Signed by Azucena Fallen on 05/24/17 at 5:28 PM (EST)**       The information contained in this medical record document is considered private and confidential patient information. This information can only be used for the medical diagnosis and/or medical services that are being provided by the patient's selected caregivers. This information can only be distributed outside of the patient's care if the patient agrees and signs waivers of authorization for this information to be sent to an outside source or route.

## 2017-06-03 ENCOUNTER — Encounter (HOSPITAL_COMMUNITY): Payer: Self-pay | Admitting: *Deleted

## 2017-06-03 ENCOUNTER — Ambulatory Visit (HOSPITAL_COMMUNITY): Payer: Medicare Other

## 2017-06-03 ENCOUNTER — Encounter (HOSPITAL_COMMUNITY)
Admission: RE | Disposition: A | Discharge status: Home / Self Care | Payer: Self-pay | Source: Ambulatory Visit | Attending: Urology

## 2017-06-03 ENCOUNTER — Ambulatory Visit (HOSPITAL_COMMUNITY)
Admission: RE | Admit: 2017-06-03 | Discharge: 2017-06-03 | Disposition: A | Discharge status: Home / Self Care | Payer: Medicare Other | Source: Ambulatory Visit | Attending: Urology | Admitting: Urology

## 2017-06-03 ENCOUNTER — Other Ambulatory Visit: Payer: Self-pay

## 2017-06-03 DIAGNOSIS — Z803 Family history of malignant neoplasm of breast: Secondary | ICD-10-CM | POA: Diagnosis not present

## 2017-06-03 DIAGNOSIS — Z79899 Other long term (current) drug therapy: Secondary | ICD-10-CM | POA: Insufficient documentation

## 2017-06-03 DIAGNOSIS — Z83511 Family history of glaucoma: Secondary | ICD-10-CM | POA: Insufficient documentation

## 2017-06-03 DIAGNOSIS — Z888 Allergy status to other drugs, medicaments and biological substances status: Secondary | ICD-10-CM | POA: Insufficient documentation

## 2017-06-03 DIAGNOSIS — N201 Calculus of ureter: Secondary | ICD-10-CM | POA: Insufficient documentation

## 2017-06-03 DIAGNOSIS — Z8042 Family history of malignant neoplasm of prostate: Secondary | ICD-10-CM | POA: Insufficient documentation

## 2017-06-03 HISTORY — PX: EXTRACORPOREAL SHOCK WAVE LITHOTRIPSY: SHX1557

## 2017-06-03 SURGERY — LITHOTRIPSY, ESWL
Anesthesia: LOCAL | Laterality: Right

## 2017-06-03 MED ORDER — DIPHENHYDRAMINE HCL 25 MG PO CAPS
25.0000 mg | ORAL_CAPSULE | ORAL | Status: AC
  Administered 2017-06-03: 25 mg via ORAL
  Filled 2017-06-03: qty 1

## 2017-06-03 MED ORDER — TRAMADOL HCL 50 MG PO TABS: 50.0000 mg | ORAL_TABLET | Freq: Four times a day (QID) | ORAL | 0 refills | Status: AC | PRN

## 2017-06-03 MED ORDER — IBUPROFEN 200 MG PO TABS: 200.0000 mg | ORAL_TABLET | Freq: Four times a day (QID) | ORAL | 0 refills | Status: DC | PRN

## 2017-06-03 MED ORDER — OXYCODONE-ACETAMINOPHEN 5-325 MG PO TABS
2.0000 | ORAL_TABLET | Freq: Once | ORAL | Status: AC
  Administered 2017-06-03: 2 via ORAL
  Filled 2017-06-03: qty 2

## 2017-06-03 MED ORDER — DIAZEPAM 5 MG PO TABS
10.0000 mg | ORAL_TABLET | ORAL | Status: AC
  Administered 2017-06-03: 10 mg via ORAL
  Filled 2017-06-03: qty 2

## 2017-06-03 MED ORDER — SODIUM CHLORIDE 0.9 % IV SOLN
INTRAVENOUS | Status: DC
  Administered 2017-06-03: 09:00:00 via INTRAVENOUS

## 2017-06-03 MED ORDER — ONDANSETRON HCL 4 MG PO TABS: 4.0000 mg | ORAL_TABLET | Freq: Three times a day (TID) | ORAL | 0 refills | Status: AC | PRN

## 2017-06-03 MED ORDER — TAMSULOSIN HCL 0.4 MG PO CAPS: 0.4000 mg | ORAL_CAPSULE | Freq: Every day | ORAL | 0 refills | Status: DC

## 2017-06-03 MED ORDER — CIPROFLOXACIN HCL 500 MG PO TABS
500.0000 mg | ORAL_TABLET | ORAL | Status: AC
  Administered 2017-06-03: 500 mg via ORAL
  Filled 2017-06-03: qty 1

## 2017-06-03 NOTE — Discharge Instructions (Signed)
Lithotripsy, Care After °This sheet gives you information about how to care for yourself after your procedure. Your health care provider may also give you more specific instructions. If you have problems or questions, contact your health care provider. °What can I expect after the procedure? °After the procedure, it is common to have: °· Some blood in your urine. This should only last for a few days. °· Soreness in your back, sides, or upper abdomen for a few days. °· Blotches or bruises on your back where the pressure wave entered the skin. °· Pain, discomfort, or nausea when pieces (fragments) of the kidney stone move through the tube that carries urine from the kidney to the bladder (ureter). Stone fragments may pass soon after the procedure, but they may continue to pass for up to 4-8 weeks. °? If you have severe pain or nausea, contact your health care provider. This may be caused by a large stone that was not broken up, and this may mean that you need more treatment. °· Some pain or discomfort during urination. °· Some pain or discomfort in the lower abdomen or (in men) at the base of the penis. ° °Follow these instructions at home: °Medicines °· Take over-the-counter and prescription medicines only as told by your health care provider. °· If you were prescribed an antibiotic medicine, take it as told by your health care provider. Do not stop taking the antibiotic even if you start to feel better. °· Do not drive for 24 hours if you were given a medicine to help you relax (sedative). °· Do not drive or use heavy machinery while taking prescription pain medicine. °Eating and drinking °· Drink enough water and fluids to keep your urine clear or pale yellow. This helps any remaining pieces of the stone to pass. It can also help prevent new stones from forming. °· Eat plenty of fresh fruits and vegetables. °· Follow instructions from your health care provider about eating and drinking restrictions. You may be  instructed: °? To reduce how much salt (sodium) you eat or drink. Check ingredients and nutrition facts on packaged foods and beverages. °? To reduce how much meat you eat. °· Eat the recommended amount of calcium for your age and gender. Ask your health care provider how much calcium you should have. °General instructions °· Get plenty of rest. °· Most people can resume normal activities 1-2 days after the procedure. Ask your health care provider what activities are safe for you. °· If directed, strain all urine through the strainer that was provided by your health care provider. °? Keep all fragments for your health care provider to see. Any stones that are found may be sent to a medical lab for examination. The stone may be as small as a grain of salt. °· Keep all follow-up visits as told by your health care provider. This is important. °Contact a health care provider if: °· You have pain that is severe or does not get better with medicine. °· You have nausea that is severe or does not go away. °· You have blood in your urine longer than your health care provider told you to expect. °· You have more blood in your urine. °· You have pain during urination that does not go away. °· You urinate more frequently than usual and this does not go away. °· You develop a rash or any other possible signs of an allergic reaction. °Get help right away if: °· You have severe pain in   your back, sides, or upper abdomen. °· You have severe pain while urinating. °· Your urine is very dark red. °· You have blood in your stool (feces). °· You cannot pass any urine at all. °· You feel a strong urge to urinate after emptying your bladder. °· You have a fever or chills. °· You develop shortness of breath, difficulty breathing, or chest pain. °· You have severe nausea that leads to persistent vomiting. °· You faint. °Summary °· After this procedure, it is common to have some pain, discomfort, or nausea when pieces (fragments) of the  kidney stone move through the tube that carries urine from the kidney to the bladder (ureter). If this pain or nausea is severe, however, you should contact your health care provider. °· Most people can resume normal activities 1-2 days after the procedure. Ask your health care provider what activities are safe for you. °· Drink enough water and fluids to keep your urine clear or pale yellow. This helps any remaining pieces of the stone to pass, and it can help prevent new stones from forming. °· If directed, strain your urine and keep all fragments for your health care provider to see. Fragments or stones may be as small as a grain of salt. °· Get help right away if you have severe pain in your back, sides, or upper abdomen or have severe pain while urinating. °This information is not intended to replace advice given to you by your health care provider. Make sure you discuss any questions you have with your health care provider. °Document Released: 06/03/2007 Document Revised: 04/04/2016 Document Reviewed: 04/04/2016 °Elsevier Interactive Patient Education © 2018 [employer] ° °

## 2017-06-03 NOTE — Interval H&P Note (Signed)
History and Physical Interval Note:  06/03/2017 9:58 AM  Brent Harris  has presented today for surgery, with the diagnosis of right ureteral stone  The various methods of treatment have been discussed with the patient and family. After consideration of risks, benefits and other options for treatment, the patient has consented to  Procedure(s): RIGHT EXTRACORPOREAL SHOCK WAVE LITHOTRIPSY (ESWL) (Right) as a surgical intervention .  The patient's history has been reviewed, patient examined, no change in status, stable for surgery.  I have reviewed the patient's chart and labs.  Stone at right L4 on KUB. Pt well. Questions were answered to the patient's satisfaction.     Festus Aloe

## 2017-06-03 NOTE — Op Note (Addendum)
Preoperative diagnosis: Right proximal 5 mm stone  Postoperative diagnosis: Right proximal 5 mm ureteral stone  Procedure: Right shockwave lithotripsy  Surgeon: Junious Silk  Anesthesia: Mac  Findings - pt tolerated well. Stone seem to fragement, but not disappear. He may need a staged procedure.   Description of procedure: After consent was obtained the patient was brought to the mobile lithotripsy unit and placed supine.  The lithotripsy head was coupled to the patient and the stone located in 2 views.  The stone was in the right upper ureter.  He was treated at a minimum power of 0.1 and a max power of 4 with an average power of 3.5.  A total of 4000 shocks were delivered.  The patient was then awakened and taken to the recovery room.  Complications: None  Blood loss: 0  Specimens: 0  Drains: None  Disposition: Patient stable to short stay.

## 2017-06-04 ENCOUNTER — Encounter (HOSPITAL_COMMUNITY): Payer: Self-pay | Admitting: Urology

## 2018-07-04 ENCOUNTER — Other Ambulatory Visit: Payer: Self-pay | Admitting: Urology

## 2018-07-24 ENCOUNTER — Encounter (HOSPITAL_BASED_OUTPATIENT_CLINIC_OR_DEPARTMENT_OTHER): Payer: Self-pay | Admitting: *Deleted

## 2018-07-24 ENCOUNTER — Other Ambulatory Visit: Payer: Self-pay

## 2018-07-24 NOTE — Progress Notes (Signed)
Spoke w/ pt via phone for pre-op interview.  Npo after mn.  Arrive at [facility].  Needs istat and ekg.

## 2018-07-28 NOTE — H&P (Signed)
CC/HPI: Tennis returns today for cystoscopy to complete his hematuria w/u. He has recovered from the virus he had at his visit on 06/12/18. His UA is clear and he has no gross hematuria. He had a KUB in 12/19 that was clear.   He has a history of prostate cancer treated with EXRT about 6 years ago. His PSA was minimally up at 0.7 in December. It was 0.61 last year.     ALLERGIES: None   MEDICATIONS: Calcium Magnesium 750 TABS Oral  Daily Vitamin tablet Oral  Lisinopril 30 mg tablet Oral  Red Yeast Rice Powder Does Not Apply  Saw Palmetto TABS Oral  Sildenafil 20 mg tablet 0 Oral  Vitamin D3 1000 UNIT Oral Capsule Oral     GU PSH: ESWL - 06/03/2017 Locm 300-399Mg /Ml Iodine,1Ml - 04/04/2017 Prostate Needle Biopsy - 2013, 2008      [place] Notes: Biopsy Of The Prostate Needle, Colonoscopy (Fiberoptic), Biopsy Of The Prostate Needle, Complete Colonoscopy  lithotripsy 06/03/17  tooth extraction     NON-GU PSH: Dental Surgery Procedure, tooth extraction - 04/27/2018 Diagnostic Colonoscopy - 2010, 2008    GU PMH: Urinary Frequency - 06/12/2018 Microscopic hematuria - 05/15/2018 Ureteral calculus - 05/17/2017, He has a 50mm right proximal stone with recent hematuria but is asymptomatic now. Since the stone appears to still be at L3, I am going to have him return in a month with a KUB and renal US. If there is no progression at that time we will need to consider treatment. , - 04/16/2017 Gross hematuria, He had an episode of painless gross hematuria. I will get him set up for a CT and f/u for cystoscopy. - 03/27/2017 History of prostate cancer (Worsening), His PSA is down to 0.61. He will need that repeated in a year. - 03/27/2017, (Improving), His PSA continues to fall and his voiding complaints have improved. He will return in 6 months with a PSA for an exam. , - 09/12/2016 (Improving), - 03/19/2016, History of prostate cancer, - 2017 ED due to arterial insufficiency, Erectile dysfunction due to  arterial insufficiency - 2017 Nocturia, Nocturia - 2017 Prostate Cancer, Prostate cancer - 2016 Dysuria, Dysuria - 2015 Elevated PSA, Elevated prostate specific antigen (PSA) - 2015 Personal Hx Oth male genital organs diseases, History of prostatitis - 2015, History of chronic prostatitis, - 2014 BPH w/o LUTS, Benign prostatic hypertrophy without lower urinary tract symptoms - 2015 History of urolithiasis, Nephrolithiasis - 2014    NON-GU PMH: Encounter for general adult medical examination without abnormal findings, Encounter for preventive health examination - 2017 Personal history of other diseases of the circulatory system, History of hypertension - 2014 Personal history of other endocrine, nutritional and metabolic disease, History of hypercholesterolemia - 2014    FAMILY HISTORY: Breast Cancer - Mother Glaucoma - Father Prostate Cancer - Father   SOCIAL HISTORY: Marital Status: Married Preferred Language: English; Ethnicity: Not Hispanic Or Latino; Race: White     Notes: Retired, Alcohol Use, Never A Smoker, Tobacco Use, Marital History - Currently Married, Occupation:, Caffeine Use   REVIEW OF SYSTEMS:    GU Review Male:   Patient denies frequent urination, hard to postpone urination, burning/ pain with urination, get up at night to urinate, leakage of urine, stream starts and stops, trouble starting your stream, have to strain to urinate , erection problems, and penile pain.  Gastrointestinal (Upper):   Patient denies nausea, vomiting, and indigestion/ heartburn.  Gastrointestinal (Lower):   Patient denies diarrhea and constipation.  Constitutional:  Patient denies fever, night sweats, weight loss, and fatigue.  Skin:   Patient denies skin rash/ lesion and itching.  Eyes:   Patient denies blurred vision and double vision.  Ears/ Nose/ Throat:   Patient denies sore throat and sinus problems.  Hematologic/Lymphatic:   Patient denies swollen glands and easy bruising.   Cardiovascular:   Patient denies leg swelling and chest pains.  Respiratory:   Patient denies cough and shortness of breath.  Endocrine:   Patient denies excessive thirst.  Musculoskeletal:   Patient denies back pain and joint pain.  Neurological:   Patient denies headaches and dizziness.  Psychologic:   Patient denies depression and anxiety.   VITAL SIGNS:      06/30/2018 09:59 AM  BP 150/94 mmHg  Pulse 80 /min  Temperature 97.2 F / 36.2 C   MULTI-SYSTEM PHYSICAL EXAMINATION:    Constitutional: Well-nourished. No physical deformities. Normally developed. Good grooming.  Respiratory: Normal breath sounds. No labored breathing, no use of accessory muscles.   Cardiovascular: Regular rate and rhythm. No murmur, no gallop.      PAST DATA REVIEWED:  Source Of History:  Patient  Urine Test Review:   Urinalysis  X-Ray Review: KUB: Reviewed Films. Discussed With Patient. I reviewed his KUB's from 12/19 and 1/19. No stones were present on the 12/19 film.    04/29/18 03/20/17 09/10/16 03/12/16 09/13/15 06/14/15 12/10/14 06/09/14  PSA  Total PSA 0.7 ng/dl 0.61 ng/mL 0.72 ng/dl 0.92 ng/dl 1.37  2.01  1.39  2.07     PROCEDURES:         Flexible Cystoscopy - 52000  Risks, benefits, and some of the potential complications of the procedure were discussed.    Meatus:  Normal size. Normal location. Normal condition.  Urethra:  No strictures. there is a polyp in the very proximal bulb arising on a thin stalk on the right. It is about the size of a rice grain.   External Sphincter:  Normal.  Verumontanum:  Normal.  Prostate:  Mild hyperplasia. Non-obstructing. mucosal blanching from radiation.   Bladder Neck:  Non-obstructing.  Ureteral Orifices:  Normal location. Normal size. Normal shape. Effluxed clear urine.  Bladder:  Mild trabeculation. No tumors. Normal mucosa. No stones.      The procedure was well tolerated and there were no complications.         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 I reviewed the risks of bleeding, infection, urethral injury and stricture, thrombotic events and anesthetic complications.   2   Microscopic hematuria - R31.21    PLAN:            Medications Stop Meds: Flomax 0.4 mg capsule  Discontinue: 06/30/2018  - Reason: The medication cycle was completed.  Tamulosin  Discontinue: 06/30/2018  - Reason: The medication cycle was completed.            Schedule Return Visit/Planned Activity: Next Available Appointment - Schedule Surgery          Document Letter(s):  Created for Patient: Clinical Summary

## 2018-07-29 ENCOUNTER — Ambulatory Visit (HOSPITAL_BASED_OUTPATIENT_CLINIC_OR_DEPARTMENT_OTHER): Payer: Medicare Other | Admitting: Anesthesiology

## 2018-07-29 ENCOUNTER — Encounter (HOSPITAL_BASED_OUTPATIENT_CLINIC_OR_DEPARTMENT_OTHER)
Admission: RE | Disposition: A | Discharge status: Home / Self Care | Payer: Self-pay | Source: Home / Self Care | Attending: Urology

## 2018-07-29 ENCOUNTER — Encounter (HOSPITAL_BASED_OUTPATIENT_CLINIC_OR_DEPARTMENT_OTHER): Payer: Self-pay | Admitting: *Deleted

## 2018-07-29 ENCOUNTER — Other Ambulatory Visit: Payer: Self-pay

## 2018-07-29 ENCOUNTER — Ambulatory Visit (HOSPITAL_BASED_OUTPATIENT_CLINIC_OR_DEPARTMENT_OTHER)
Admission: RE | Admit: 2018-07-29 | Discharge: 2018-07-29 | Disposition: A | Discharge status: Home / Self Care | Payer: Medicare Other | Attending: Urology | Admitting: Urology

## 2018-07-29 DIAGNOSIS — Z87891 Personal history of nicotine dependence: Secondary | ICD-10-CM | POA: Insufficient documentation

## 2018-07-29 DIAGNOSIS — Z8546 Personal history of malignant neoplasm of prostate: Secondary | ICD-10-CM | POA: Diagnosis not present

## 2018-07-29 DIAGNOSIS — N362 Urethral caruncle: Secondary | ICD-10-CM | POA: Insufficient documentation

## 2018-07-29 DIAGNOSIS — I10 Essential (primary) hypertension: Secondary | ICD-10-CM | POA: Insufficient documentation

## 2018-07-29 DIAGNOSIS — R3129 Other microscopic hematuria: Secondary | ICD-10-CM | POA: Insufficient documentation

## 2018-07-29 DIAGNOSIS — Z79899 Other long term (current) drug therapy: Secondary | ICD-10-CM | POA: Diagnosis not present

## 2018-07-29 DIAGNOSIS — R319 Hematuria, unspecified: Secondary | ICD-10-CM | POA: Diagnosis present

## 2018-07-29 HISTORY — DX: Presence of spectacles and contact lenses: Z97.3

## 2018-07-29 HISTORY — DX: Personal history of other diseases of male genital organs: Z87.438

## 2018-07-29 HISTORY — DX: Urethral caruncle: N36.2

## 2018-07-29 HISTORY — PX: CYSTOSCOPY WITH BIOPSY: SHX5122

## 2018-07-29 HISTORY — DX: Personal history of irradiation: Z92.3

## 2018-07-29 LAB — POCT I-STAT 4, (NA,K, GLUC, HGB,HCT)
Glucose, Bld: 111 mg/dL — ABNORMAL HIGH (ref 70–99)
HCT: 37 % — ABNORMAL LOW (ref 39.0–52.0)
Hemoglobin: 12.6 g/dL — ABNORMAL LOW (ref 13.0–17.0)
Potassium: 4 mmol/L (ref 3.5–5.1)
Sodium: 136 mmol/L (ref 135–145)

## 2018-07-29 SURGERY — CYSTOSCOPY, WITH BIOPSY
Anesthesia: General | Site: Bladder

## 2018-07-29 MED ORDER — FENTANYL CITRATE (PF) 100 MCG/2ML IJ SOLN
25.0000 ug | INTRAMUSCULAR | Status: DC | PRN
  Filled 2018-07-29: qty 1

## 2018-07-29 MED ORDER — CEFAZOLIN SODIUM-DEXTROSE 2-4 GM/100ML-% IV SOLN
INTRAVENOUS | Status: AC
  Filled 2018-07-29: qty 100

## 2018-07-29 MED ORDER — OXYCODONE HCL 5 MG PO TABS
ORAL_TABLET | ORAL | Status: AC
  Filled 2018-07-29: qty 1

## 2018-07-29 MED ORDER — SODIUM CHLORIDE 0.9 % IV SOLN
250.0000 mL | INTRAVENOUS | Status: DC | PRN
  Filled 2018-07-29: qty 250

## 2018-07-29 MED ORDER — STERILE WATER FOR IRRIGATION IR SOLN
Status: DC | PRN
  Administered 2018-07-29: 1

## 2018-07-29 MED ORDER — PROPOFOL 10 MG/ML IV BOLUS
INTRAVENOUS | Status: AC
  Filled 2018-07-29: qty 20

## 2018-07-29 MED ORDER — SODIUM CHLORIDE 0.9% FLUSH
3.0000 mL | Freq: Two times a day (BID) | INTRAVENOUS | Status: DC
  Filled 2018-07-29: qty 3

## 2018-07-29 MED ORDER — CEFAZOLIN SODIUM-DEXTROSE 2-4 GM/100ML-% IV SOLN
2.0000 g | INTRAVENOUS | Status: AC
  Administered 2018-07-29: 2 g via INTRAVENOUS
  Filled 2018-07-29: qty 100

## 2018-07-29 MED ORDER — MORPHINE SULFATE (PF) 2 MG/ML IV SOLN
2.0000 mg | INTRAVENOUS | Status: DC | PRN
  Filled 2018-07-29: qty 1

## 2018-07-29 MED ORDER — PROPOFOL 10 MG/ML IV BOLUS
INTRAVENOUS | Status: DC | PRN
  Administered 2018-07-29: 200 mg via INTRAVENOUS

## 2018-07-29 MED ORDER — LIDOCAINE 2% (20 MG/ML) 5 ML SYRINGE
INTRAMUSCULAR | Status: AC
  Filled 2018-07-29: qty 5

## 2018-07-29 MED ORDER — ACETAMINOPHEN 650 MG RE SUPP
650.0000 mg | RECTAL | Status: DC | PRN
  Filled 2018-07-29: qty 1

## 2018-07-29 MED ORDER — FENTANYL CITRATE (PF) 100 MCG/2ML IJ SOLN
INTRAMUSCULAR | Status: DC | PRN
  Administered 2018-07-29 (×2): 50 ug via INTRAVENOUS

## 2018-07-29 MED ORDER — ONDANSETRON HCL 4 MG/2ML IJ SOLN
INTRAMUSCULAR | Status: DC | PRN
  Administered 2018-07-29: 4 mg via INTRAVENOUS

## 2018-07-29 MED ORDER — PROMETHAZINE HCL 25 MG/ML IJ SOLN
6.2500 mg | INTRAMUSCULAR | Status: DC | PRN
  Filled 2018-07-29: qty 1

## 2018-07-29 MED ORDER — DEXAMETHASONE SODIUM PHOSPHATE 10 MG/ML IJ SOLN
INTRAMUSCULAR | Status: AC
  Filled 2018-07-29: qty 1

## 2018-07-29 MED ORDER — SODIUM CHLORIDE 0.9% FLUSH
3.0000 mL | INTRAVENOUS | Status: DC | PRN
  Filled 2018-07-29: qty 3

## 2018-07-29 MED ORDER — DEXAMETHASONE SODIUM PHOSPHATE 10 MG/ML IJ SOLN
INTRAMUSCULAR | Status: DC | PRN
  Administered 2018-07-29: 5 mg via INTRAVENOUS

## 2018-07-29 MED ORDER — ONDANSETRON HCL 4 MG/2ML IJ SOLN
INTRAMUSCULAR | Status: AC
  Filled 2018-07-29: qty 2

## 2018-07-29 MED ORDER — OXYCODONE HCL 5 MG PO TABS
5.0000 mg | ORAL_TABLET | ORAL | Status: DC | PRN
  Administered 2018-07-29: 5 mg via ORAL
  Filled 2018-07-29: qty 2

## 2018-07-29 MED ORDER — ACETAMINOPHEN 325 MG PO TABS
650.0000 mg | ORAL_TABLET | ORAL | Status: DC | PRN
  Filled 2018-07-29: qty 2

## 2018-07-29 MED ORDER — FENTANYL CITRATE (PF) 100 MCG/2ML IJ SOLN
INTRAMUSCULAR | Status: AC
  Filled 2018-07-29: qty 2

## 2018-07-29 MED ORDER — MIDAZOLAM HCL 2 MG/2ML IJ SOLN
INTRAMUSCULAR | Status: AC
  Filled 2018-07-29: qty 2

## 2018-07-29 MED ORDER — LACTATED RINGERS IV SOLN
INTRAVENOUS | Status: DC
  Administered 2018-07-29: 08:00:00 via INTRAVENOUS
  Administered 2018-07-29: 1000 mL via INTRAVENOUS
  Filled 2018-07-29: qty 1000

## 2018-07-29 MED ORDER — MIDAZOLAM HCL 2 MG/2ML IJ SOLN
INTRAMUSCULAR | Status: DC | PRN
  Administered 2018-07-29: 2 mg via INTRAVENOUS

## 2018-07-29 MED ORDER — LIDOCAINE HCL (CARDIAC) PF 100 MG/5ML IV SOSY
PREFILLED_SYRINGE | INTRAVENOUS | Status: DC | PRN
  Administered 2018-07-29: 60 mg via INTRAVENOUS

## 2018-07-29 MED ORDER — TRAMADOL HCL 50 MG PO TABS: 50.0000 mg | ORAL_TABLET | Freq: Four times a day (QID) | ORAL | 0 refills | Status: AC | PRN

## 2018-07-29 SURGICAL SUPPLY — 22 items
BAG DRAIN URO-CYSTO SKYTR STRL (DRAIN) ×2 IMPLANT
BAG DRN UROCATH (DRAIN) ×1
CATH FOLEY 2WAY SLVR  5CC 16FR (CATHETERS)
CATH FOLEY 2WAY SLVR 5CC 16FR (CATHETERS) IMPLANT
CLOTH BEACON ORANGE TIMEOUT ST (SAFETY) ×2 IMPLANT
ELECT REM PT RETURN 9FT ADLT (ELECTROSURGICAL) ×2
ELECTRODE REM PT RTRN 9FT ADLT (ELECTROSURGICAL) ×1 IMPLANT
GLOVE SURG SS PI 8.0 STRL IVOR (GLOVE) ×2 IMPLANT
GOWN STRL REUS W/ TWL LRG LVL3 (GOWN DISPOSABLE) ×1 IMPLANT
GOWN STRL REUS W/ TWL XL LVL3 (GOWN DISPOSABLE) ×1 IMPLANT
GOWN STRL REUS W/TWL LRG LVL3 (GOWN DISPOSABLE) ×2
GOWN STRL REUS W/TWL XL LVL3 (GOWN DISPOSABLE) ×2
KIT TURNOVER CYSTO (KITS) ×2 IMPLANT
MANIFOLD NEPTUNE II (INSTRUMENTS) ×2 IMPLANT
NDL SAFETY ECLIPSE 18X1.5 (NEEDLE) IMPLANT
NEEDLE HYPO 18GX1.5 SHARP (NEEDLE)
NEEDLE HYPO 22GX1.5 SAFETY (NEEDLE) IMPLANT
NS IRRIG 500ML POUR BTL (IV SOLUTION) IMPLANT
PACK CYSTO (CUSTOM PROCEDURE TRAY) ×2 IMPLANT
SYR 20CC LL (SYRINGE) IMPLANT
TUBE CONNECTING 12X1/4 (SUCTIONS) ×2 IMPLANT
WATER STERILE IRR 3000ML UROMA (IV SOLUTION) ×2 IMPLANT

## 2018-07-29 NOTE — Transfer of Care (Signed)
Immediate Anesthesia Transfer of Care Note  Patient: Brent Harris  Procedure(s) Performed: CYSTOSCOPY WITH BIOPSY (N/A Bladder)  Patient Location: PACU  Anesthesia Type:General  Level of Consciousness: awake, alert , oriented, drowsy and patient cooperative  Airway & Oxygen Therapy: Patient Spontanous Breathing and Patient connected to nasal cannula oxygen  Post-op Assessment: Report given to RN and Post -op Vital signs reviewed and stable  Post vital signs: Reviewed and stable  Last Vitals:  Vitals Value Taken Time  BP    Temp    Pulse 71 07/29/2018  9:57 AM  Resp 17 07/29/2018  9:57 AM  SpO2 97 % 07/29/2018  9:57 AM  Vitals shown include unvalidated device data.  Last Pain:  Vitals:   07/29/18 0647  TempSrc: Oral      Patients Stated Pain Goal: 3 (59/74/16 3845)  Complications: No apparent anesthesia complications

## 2018-07-29 NOTE — Anesthesia Postprocedure Evaluation (Signed)
Anesthesia Post Note  Patient: Brent Harris  Procedure(s) Performed: CYSTOSCOPY WITH BIOPSY (N/A Bladder)     Patient location during evaluation: PACU Anesthesia Type: General Level of consciousness: awake and alert Pain management: pain level controlled Vital Signs Assessment: post-procedure vital signs reviewed and stable Respiratory status: spontaneous breathing, nonlabored ventilation, respiratory function stable and patient connected to nasal cannula oxygen Cardiovascular status: blood pressure returned to baseline and stable Postop Assessment: no apparent nausea or vomiting Anesthetic complications: no    Last Vitals:  Vitals:   07/29/18 0647 07/29/18 0955  BP: (!) 146/84 (!) 129/94  Pulse: 86 72  Resp: 18   Temp: 36.9 C 36.9 C  SpO2: 98% 98%    Last Pain:  Vitals:   07/29/18 0647  TempSrc: Oral                 Ayane Delancey S

## 2018-07-29 NOTE — Anesthesia Preprocedure Evaluation (Signed)
Anesthesia Evaluation  Patient identified by MRN, date of birth, ID band Patient awake    Reviewed: Allergy & Precautions, NPO status , Patient's Chart, lab work & pertinent test results  Airway Mallampati: II  TM Distance: >3 FB Neck ROM: Full    Dental no notable dental hx.    Pulmonary neg pulmonary ROS, former smoker,    Pulmonary exam normal breath sounds clear to auscultation       Cardiovascular hypertension, Normal cardiovascular exam Rhythm:Regular Rate:Normal     Neuro/Psych negative neurological ROS  negative psych ROS   GI/Hepatic negative GI ROS, Neg liver ROS,   Endo/Other  negative endocrine ROS  Renal/GU negative Renal ROS  negative genitourinary   Musculoskeletal negative musculoskeletal ROS (+)   Abdominal   Peds negative pediatric ROS (+)  Hematology negative hematology ROS (+)   Anesthesia Other Findings   Reproductive/Obstetrics negative OB ROS                             Anesthesia Physical Anesthesia Plan  ASA: II  Anesthesia Plan: General   Post-op Pain Management:    Induction: Intravenous  PONV Risk Score and Plan: 2 and Ondansetron, Dexamethasone and Treatment may vary due to age or medical condition  Airway Management Planned: LMA  Additional Equipment:   Intra-op Plan:   Post-operative Plan: Extubation in OR  Informed Consent: I have reviewed the patients History and Physical, chart, labs and discussed the procedure including the risks, benefits and alternatives for the proposed anesthesia with the patient or authorized representative who has indicated his/her understanding and acceptance.     Dental advisory given  Plan Discussed with: CRNA and Surgeon  Anesthesia Plan Comments:         Anesthesia Quick Evaluation

## 2018-07-29 NOTE — Interval H&P Note (Signed)
History and Physical Interval Note: NO change  07/29/2018 8:53 AM  Brent Harris  has presented today for surgery, with the diagnosis of URETHRAL POLYP  The various methods of treatment have been discussed with the patient and family. After consideration of risks, benefits and other options for treatment, the patient has consented to  Procedure(s): CYSTOSCOPY WITH BIOPSY (N/A) as a surgical intervention .  The patient's history has been reviewed, patient examined, no change in status, stable for surgery.  I have reviewed the patient's chart and labs.  Questions were answered to the patient's satisfaction.     [name]

## 2018-07-29 NOTE — Anesthesia Procedure Notes (Signed)
Procedure Name: LMA Insertion Date/Time: 07/29/2018 9:27 AM Performed by: Raenette Rover, CRNA Pre-anesthesia Checklist: Patient identified, Emergency Drugs available, Suction available and Patient being monitored Patient Re-evaluated:Patient Re-evaluated prior to induction Oxygen Delivery Method: Circle system utilized Preoxygenation: Pre-oxygenation with 100% oxygen Induction Type: IV induction LMA: LMA inserted LMA Size: 4.0 Number of attempts: 1 Placement Confirmation: positive ETCO2,  CO2 detector and breath sounds checked- equal and bilateral Tube secured with: Tape Dental Injury: Teeth and Oropharynx as per pre-operative assessment

## 2018-07-29 NOTE — Discharge Instructions (Addendum)
Indwelling Urinary Catheter Insertion, Care After This sheet gives you information about how to care for yourself after your procedure. Your health care provider may also give you more specific instructions. If you have problems or questions, contact your health care provider. What can I expect after the procedure? After the procedure, it is common to have:  Slight discomfort around your urethra where the catheter enters your body. Follow these instructions at home:   Keep the drainage bag at or below the level of your bladder. Doing this ensures that urine can only drain out, not back into your body.  Secure the catheter tubing and drainage bag to your leg or thigh to keep it from moving.  Check the catheter tubing regularly to make sure there are no kinks or blockages.  Take showers daily to keep the catheter clean. Do not take a bath.  Do not pull on your catheter or try to remove it.  Disconnect the tubing and drainage bag as little as possible.  Empty the drainage bag every 2-4 hours, or more often if needed. Do not let the bag get completely full.  Wash your hands with soap and water before and after touching the catheter, tubing, or drainage bag.  Do not let the drainage bag or catheter tubing touch the floor.  Drink enough fluids to keep your urine clear or pale yellow, or as told by your health care provider. Contact a health care provider if:  Urine stops flowing into the drainage bag.  You feel pain or pressure in the bladder area.  You have back pain.  Your catheter gets clogged.  Your catheter starts to leak.  Your urine looks cloudy.  Your drainage bag or tubing looks dirty.  You notice a bad smell when emptying your drainage bag. Get help right away if:  You have a fever or chills.  You have severe pain in your back or your lower abdomen.  You have warmth, redness, swelling, or pain in the urethra area.  You notice blood in your urine.  Your  catheter gets pulled out. Summary  Do not pull on your catheter or try to remove it.  Keep the drainage bag at or below the level of your bladder, but do not let the drainage bag or catheter tubing touch the floor.  Wash your hands with soap and water before and after touching the catheter, tubing, or drainage bag.  Contact your health care provider if you have a fever, chills, or any other signs of infection.  You may remove the catheter in the morning by cutting off the side arm with the orange nipple.  After the fluid drains, the catheter should just slide out.  If you don't feel comfortable doing this, you can come to the office to have it removed.  I have given you a script for Tramadol for catheter discomfort, but you don't have to fill it if you are tolerating the catheter well.   This information is not intended to replace advice given to you by your health care provider. Make sure you discuss any questions you have with your health care provider. Document Released: 06/23/2016 Document Revised: 06/23/2016 Document Reviewed: 06/23/2016 Elsevier Interactive Patient Education  2019 [place] CARE INSTRUCTIONS  Activity: Rest for the remainder of the day.  Do not drive or operate equipment today.  You may resume normal activities in one to two days as instructed by your physician.   Meals: Drink plenty of  liquids and eat light foods such as gelatin or soup this evening.  You may return to a normal meal plan tomorrow.  Return to Work: You may return to work in one to two days or as instructed by your physician.  Special Instructions / Symptoms: Call your physician if any of these symptoms occur:   -persistent or heavy bleeding  -bleeding which continues after first few urination  -large blood clots that are difficult to pass  -urine stream diminishes or stops completely  -fever equal to or higher than 101 degrees Farenheit.  -cloudy urine with a  strong, foul odor  -severe pain  Females should always wipe from front to back after elimination.  You may feel some burning pain when you urinate.  This should disappear with time.  Applying moist heat to the lower abdomen or a hot tub bath may help relieve the pain. \    Patient Signature:  ________________________________________________________  Nurse's Signature:  ________________________________________________________     Post Anesthesia Home Care Instructions  Activity: Get plenty of rest for the remainder of the day. A responsible individual must stay with you for 24 hours following the procedure.  For the next 24 hours, DO NOT: -Drive a car -Paediatric nurse -Drink alcoholic beverages -Take any medication unless instructed by your physician -Make any legal decisions or sign important papers.  Meals: Start with liquid foods such as gelatin or soup. Progress to regular foods as tolerated. Avoid greasy, spicy, heavy foods. If nausea and/or vomiting occur, drink only clear liquids until the nausea and/or vomiting subsides. Call your physician if vomiting continues.  Special Instructions/Symptoms: Your throat may feel dry or sore from the anesthesia or the breathing tube placed in your throat during surgery. If this causes discomfort, gargle with warm salt water. The discomfort should disappear within 24 hours.

## 2018-07-29 NOTE — Op Note (Signed)
Procedure: Cystoscopy with biopsy and fulguration of urethral polyp.  Preop diagnosis: Urethral polyp.  Postop diagnosis: Same.  Surgeon: Dr. [name].  Anesthesia: General.  Specimen: Urethral polyp.  Drain: 36 [place] council catheter.  EBL: None.  Complications: None.  Indications: Brent Harris is a 68 year old male with a history of prostate cancer and prior seed implantation.  He had recent microhematuria and on evaluation was found to have a polyp in the bulbar urethra that was felt to require biopsy.  Procedure: Was taken operating room where he was given 2 g of Ancef.  General anesthetic was induced.  He was placed in the lithotomy position and fitted with PAS hose.  His perineum and genitalia were prepped with Betadine solution he was draped in usual sterile fashion.  Cystoscopy was performed using the 30 degree lens and 23 [place] scope.  Examination revealed a normal urethra with the exception of a right grain sized polyp arising from the anterior mid bulbar urethra.  The external sphincter was intact.  The prostatic urethra had radiation changes with some mild inflammatory edema at the right apex.  Semination of bladder revealed mild to moderate trabeculation without tumors, stones or inflammation.  Ureteral orifice ease were unremarkable.  A cup biopsy forceps was used to remove the polyp from the urethra.  Once the specimen been removed, inspection demonstrated some mucosal tearing and I felt a catheter was indicated.  A sensor guidewire was passed through the true urethral lumen into the bladder which was confirmed with fluoroscopy.  A 16 [place] council catheter was then passed over the wire into the bladder and the wire was removed.  The balloon was filled with 10 mL of sterile fluid.  The cath was placed to straight drainage.  He was taken down from lithotomy position, his anesthetic was reversed and he was moved recovery in stable condition.  There were no complications.

## 2018-07-30 ENCOUNTER — Encounter (HOSPITAL_BASED_OUTPATIENT_CLINIC_OR_DEPARTMENT_OTHER): Payer: Self-pay | Admitting: Urology

## 2018-07-30 NOTE — Progress Notes (Signed)
Instructed patient to remove catheter as soon as possible this morning in case he has trouble urinating within 6 hours and to call Dr. Ralene Muskrat office if he has trouble urinating or feels full and is unable to relieve his bladder.  Pt voiced understanding.  Also instructed him not to drive until tramadol is out of his system.

## 2019-07-06 ENCOUNTER — Ambulatory Visit: Payer: Self-pay | Attending: Internal Medicine

## 2019-07-06 DIAGNOSIS — Z23 Encounter for immunization: Secondary | ICD-10-CM | POA: Insufficient documentation

## 2019-07-06 NOTE — Progress Notes (Signed)
   Covid-19 Vaccination Clinic  Name:  Brent Harris    MRN: [id number] DOB: [birth year]  07/06/2019  [name] was observed post Covid-19 immunization for 15 minutes without incidence. He was provided with Vaccine Information Sheet and instruction to access the V-Safe system.   [name] was instructed to call 911 with any severe reactions post vaccine: Marland Kitchen Difficulty breathing  . Swelling of your face and throat  . A fast heartbeat  . A bad rash all over your body  . Dizziness and weakness    Immunizations Administered    Name Date Dose VIS Date Route   Pfizer COVID-19 Vaccine 07/06/2019  5:10 PM 0.3 mL 05/08/2019 Intramuscular   Manufacturer: [place]   Lot: [id number]   [place]: [id number]

## 2019-07-23 IMAGING — CR DG ABDOMEN 1V
2 series · 2 of 2 positions shown · non-contrast
Comparison: The previously demonstrated right proximal ureteral
stone now projects over the right L4 transverse process.

CLINICAL DATA: E operative examination for right-sided kidney
stone. No current complaints.

EXAM:
ABDOMEN - 1 VIEW

[t abdomen supine (1 of 2)]
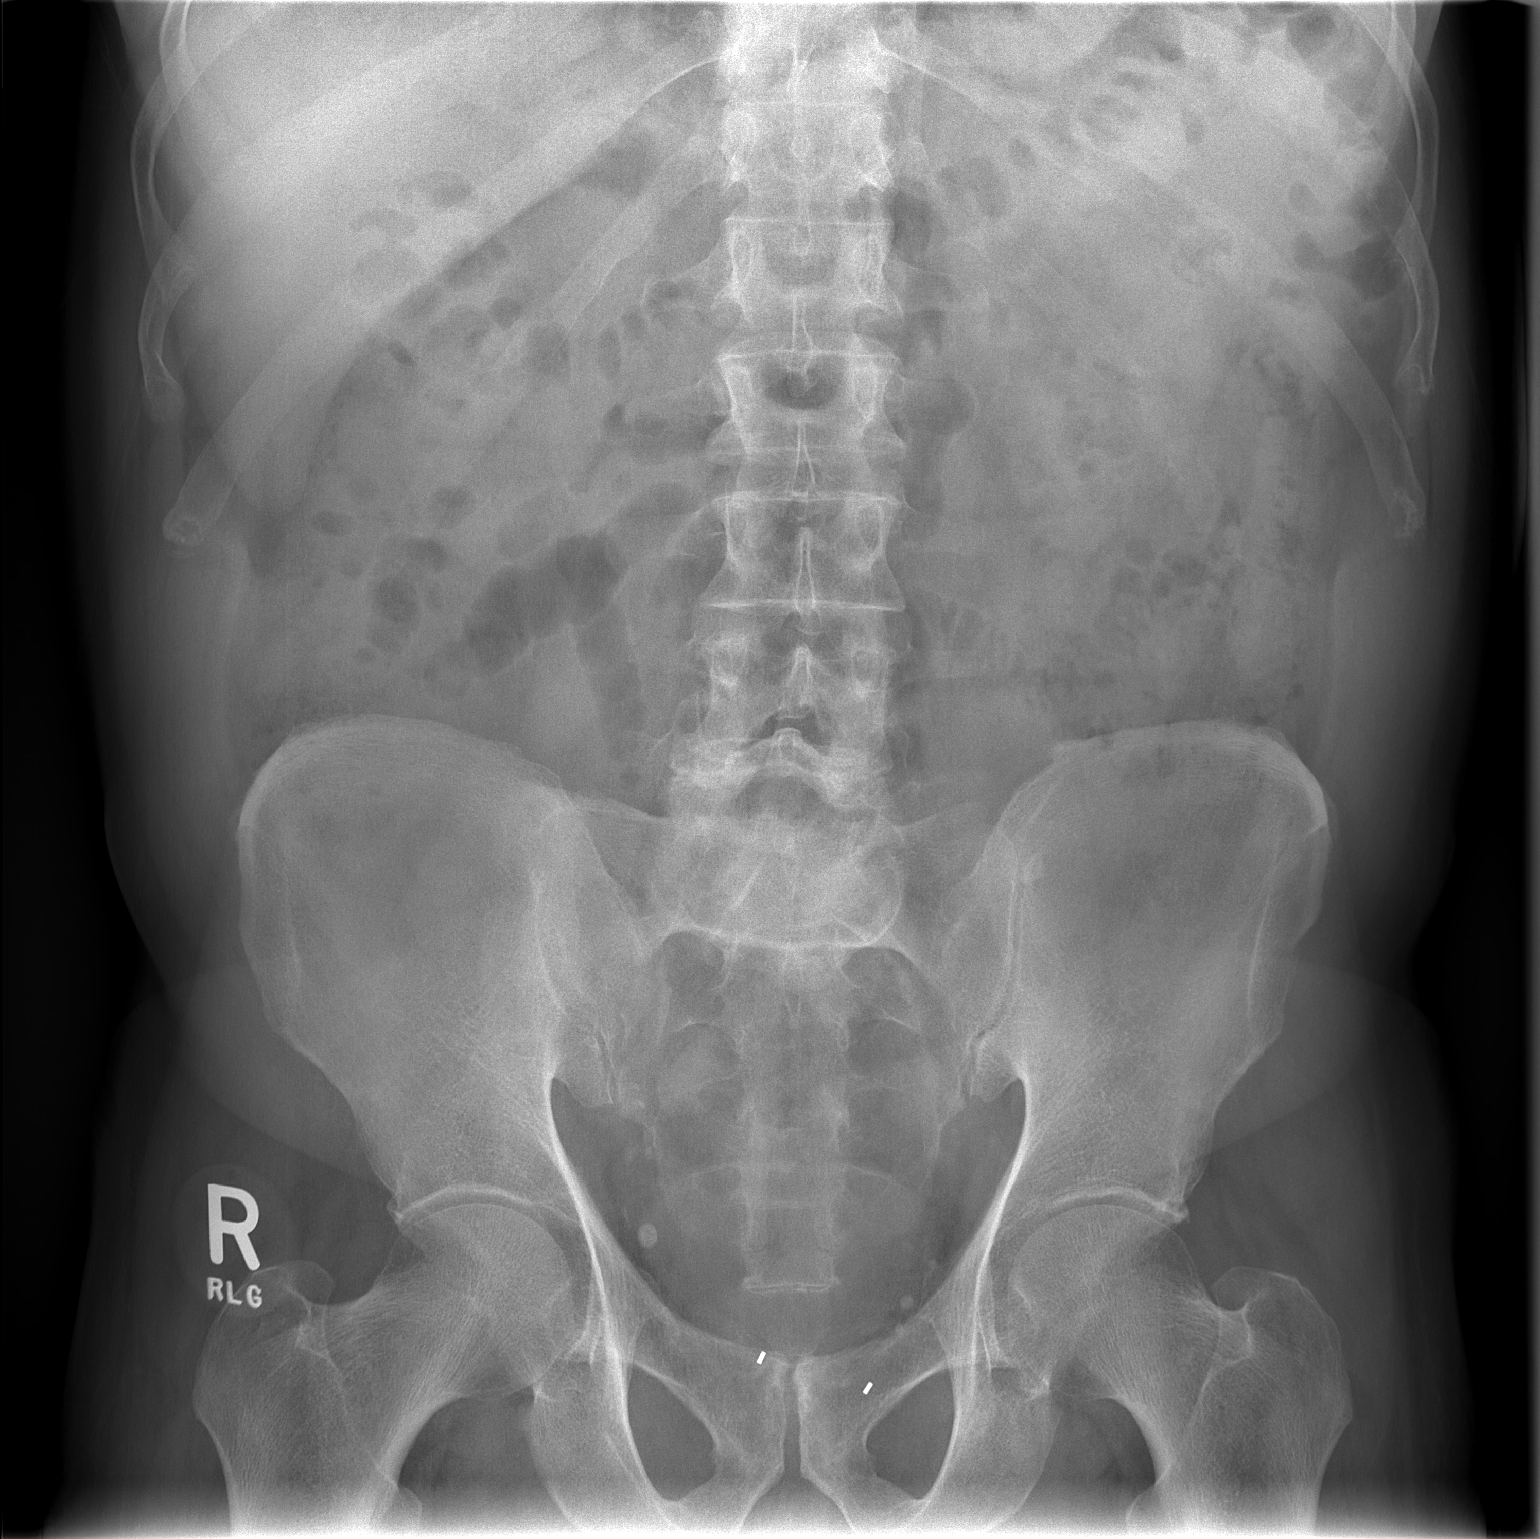

[t abdomen supine (2 of 2)]
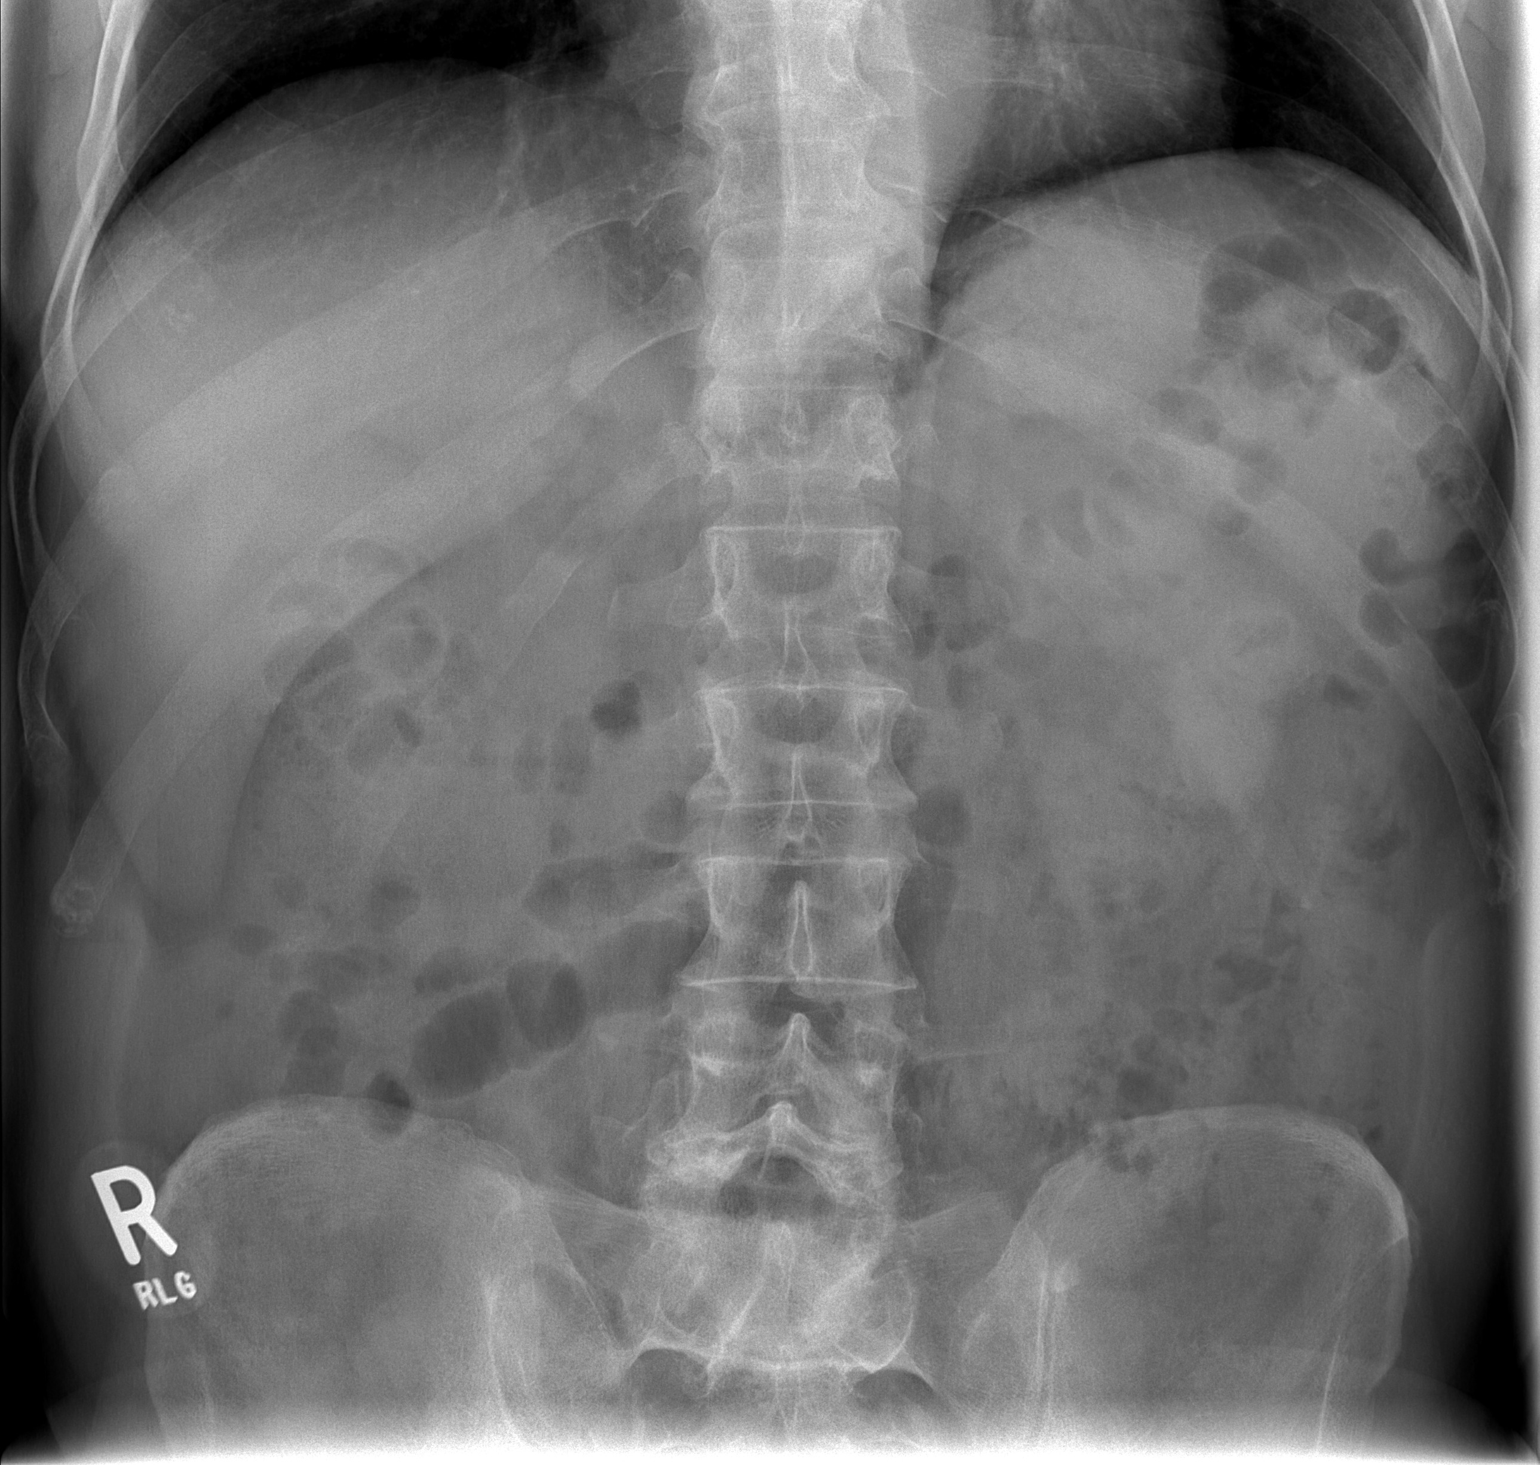

[2 of 2 positions shown; findings below may reference images not displayed]

This is
slightly more inferiorly positioned than on the previous study. No
definite stones project over either kidney. There are stable pelvic
phleboliths. The bowel gas pattern is normal.
FINDINGS: Slight distal migration of the known proximal right ureteral stone.
IMPRESSION: Negative.

## 2019-08-01 ENCOUNTER — Ambulatory Visit: Payer: Self-pay | Attending: Internal Medicine

## 2019-08-01 DIAGNOSIS — Z23 Encounter for immunization: Secondary | ICD-10-CM | POA: Insufficient documentation

## 2019-08-01 NOTE — Progress Notes (Signed)
   Covid-19 Vaccination Clinic  Name:  VESPER WEIPERT    MRN: [id number] DOB: 12-28-1950  08/01/2019  Mr. Eads was observed post Covid-19 immunization for 15 minutes without incident. He was provided with Vaccine Information Sheet and instruction to access the V-Safe system.   Mr. Scavetta was instructed to call 911 with any severe reactions post vaccine: Marland Kitchen Difficulty breathing  . Swelling of face and throat  . A fast heartbeat  . A bad rash all over body  . Dizziness and weakness   Immunizations Administered    Name Date Dose VIS Date Route   Pfizer COVID-19 Vaccine 08/01/2019  2:04 PM 0.3 mL 05/08/2019 Intramuscular   Manufacturer: [place]   Lot: [id number]   [place]: [id number]

## 2019-11-16 DIAGNOSIS — I10 Essential (primary) hypertension: Secondary | ICD-10-CM | POA: Diagnosis not present

## 2019-11-16 DIAGNOSIS — R7309 Other abnormal glucose: Secondary | ICD-10-CM | POA: Diagnosis not present

## 2020-04-19 DIAGNOSIS — Z8546 Personal history of malignant neoplasm of prostate: Secondary | ICD-10-CM | POA: Diagnosis not present

## 2020-04-28 ENCOUNTER — Encounter: Payer: Self-pay | Admitting: Cardiology

## 2020-04-29 DIAGNOSIS — R3121 Asymptomatic microscopic hematuria: Secondary | ICD-10-CM | POA: Diagnosis not present

## 2020-04-29 DIAGNOSIS — N5201 Erectile dysfunction due to arterial insufficiency: Secondary | ICD-10-CM | POA: Diagnosis not present

## 2020-04-29 DIAGNOSIS — Z87442 Personal history of urinary calculi: Secondary | ICD-10-CM | POA: Diagnosis not present

## 2020-04-29 DIAGNOSIS — Z8546 Personal history of malignant neoplasm of prostate: Secondary | ICD-10-CM | POA: Diagnosis not present

## 2020-05-10 DIAGNOSIS — Z125 Encounter for screening for malignant neoplasm of prostate: Secondary | ICD-10-CM | POA: Diagnosis not present

## 2020-05-10 DIAGNOSIS — I10 Essential (primary) hypertension: Secondary | ICD-10-CM | POA: Diagnosis not present

## 2020-05-10 DIAGNOSIS — E7800 Pure hypercholesterolemia, unspecified: Secondary | ICD-10-CM | POA: Diagnosis not present

## 2020-05-17 DIAGNOSIS — I10 Essential (primary) hypertension: Secondary | ICD-10-CM | POA: Diagnosis not present

## 2020-05-17 DIAGNOSIS — J302 Other seasonal allergic rhinitis: Secondary | ICD-10-CM | POA: Diagnosis not present

## 2020-05-17 DIAGNOSIS — E7800 Pure hypercholesterolemia, unspecified: Secondary | ICD-10-CM | POA: Diagnosis not present

## 2020-05-17 DIAGNOSIS — Z0000 Encounter for general adult medical examination without abnormal findings: Secondary | ICD-10-CM | POA: Diagnosis not present

## 2020-05-17 DIAGNOSIS — Z8546 Personal history of malignant neoplasm of prostate: Secondary | ICD-10-CM | POA: Diagnosis not present

## 2020-05-17 DIAGNOSIS — N529 Male erectile dysfunction, unspecified: Secondary | ICD-10-CM | POA: Diagnosis not present

## 2020-05-17 DIAGNOSIS — N200 Calculus of kidney: Secondary | ICD-10-CM | POA: Diagnosis not present

## 2020-05-17 DIAGNOSIS — R7309 Other abnormal glucose: Secondary | ICD-10-CM | POA: Diagnosis not present

## 2020-05-25 DIAGNOSIS — Z1212 Encounter for screening for malignant neoplasm of rectum: Secondary | ICD-10-CM | POA: Diagnosis not present

## 2020-05-25 DIAGNOSIS — Z1211 Encounter for screening for malignant neoplasm of colon: Secondary | ICD-10-CM | POA: Diagnosis not present

## 2020-06-01 DIAGNOSIS — I2510 Atherosclerotic heart disease of native coronary artery without angina pectoris: Secondary | ICD-10-CM | POA: Insufficient documentation

## 2020-06-01 DIAGNOSIS — I2584 Coronary atherosclerosis due to calcified coronary lesion: Secondary | ICD-10-CM | POA: Insufficient documentation

## 2020-07-06 DIAGNOSIS — I2584 Coronary atherosclerosis due to calcified coronary lesion: Secondary | ICD-10-CM | POA: Diagnosis not present

## 2020-07-06 DIAGNOSIS — I2510 Atherosclerotic heart disease of native coronary artery without angina pectoris: Secondary | ICD-10-CM | POA: Diagnosis not present

## 2020-07-06 DIAGNOSIS — E7800 Pure hypercholesterolemia, unspecified: Secondary | ICD-10-CM | POA: Diagnosis not present

## 2020-07-22 ENCOUNTER — Ambulatory Visit: Payer: Medicare PPO | Admitting: Cardiology

## 2020-07-22 ENCOUNTER — Other Ambulatory Visit: Payer: Self-pay

## 2020-07-22 ENCOUNTER — Encounter: Payer: Self-pay | Admitting: Cardiology

## 2020-07-22 VITALS — BP 130/80 | HR 81 | Ht 71.0 in | Wt 177.0 lb

## 2020-07-22 DIAGNOSIS — I2584 Coronary atherosclerosis due to calcified coronary lesion: Secondary | ICD-10-CM | POA: Diagnosis not present

## 2020-07-22 DIAGNOSIS — I10 Essential (primary) hypertension: Secondary | ICD-10-CM | POA: Diagnosis not present

## 2020-07-22 DIAGNOSIS — I2510 Atherosclerotic heart disease of native coronary artery without angina pectoris: Secondary | ICD-10-CM | POA: Diagnosis not present

## 2020-07-22 DIAGNOSIS — R931 Abnormal findings on diagnostic imaging of heart and coronary circulation: Secondary | ICD-10-CM | POA: Diagnosis not present

## 2020-07-22 DIAGNOSIS — E785 Hyperlipidemia, unspecified: Secondary | ICD-10-CM | POA: Diagnosis not present

## 2020-07-22 NOTE — Progress Notes (Signed)
Primary Care Provider: Deland Pretty, MD Cardiologist: No primary care provider on file. Electrophysiologist: None  Clinic Note: Chief Complaint  Patient presents with  . New Patient (Initial Visit)  . Coronary Artery Disease    Coronary calcium score checked by PCP   ===================================  ASSESSMENT/PLAN   Problem List Items Addressed This Visit    Coronary artery calcification of native artery -> coronary calcium score 156 (152 for LAD) (Chronic)    Coronary Calcium Score 150 is borderline elevated, not overly concerning, especially the patient is not actively having any chest pain or symptoms.  We will continue to monitor, if he were to have symptoms of exertional dyspnea, chest pain or simply worsening exercise tolerance, we could consider ischemic evaluation, most likely with Coronary CTA, however Myoview would be reasonable since there is no suggestion of lesions in the left main.  However, with no active symptoms, there is no indication to test.  Plan for now will be to continue aggressive Margaretann Loveless modification with blood pressure and lipid control. The use of prophylactic aspirin is probably not necessary at this point, with exception of back to all of the calcium.  All the calcium appears to be in one-vessel-the LAD.  Plan: Continue aspirin, statin and ACE inhibitor.      Relevant Medications   aspirin 81 MG chewable tablet   rosuvastatin (CRESTOR) 20 MG tablet   Abnormal screening cardiac CT - Primary (Chronic)    Coronary Score somewhat elevated.  156 is not overly concerning.  With no active symptoms, no need for further testing at this point, simply increase intensity of medical management.      Relevant Orders   EKG 12-Lead (Completed)   Essential hypertension (Chronic)    Blood pressure pretty well controlled.  On ACE inhibitor.      Relevant Medications   aspirin 81 MG chewable tablet   rosuvastatin (CRESTOR) 20 MG tablet    Hyperlipidemia with target LDL less than 100 (Chronic)    With given his cardiac risk now being increased with a coronary calcium score, not unreasonable to shoot for a target LDL less than 100 if not less than 70.  Was just started on statin recently.  We also talked about dietary modification with exercise.      Relevant Medications   aspirin 81 MG chewable tablet   rosuvastatin (CRESTOR) 20 MG tablet      ===================================  HPI:    Brent Harris is a 70 y.o. male with a PMH notable for Hypertension, Hyperlipidemia who is being seen today for the evaluation of  NEW DIAGNOSIS OF CAD  at the request of Deland Pretty, MD.  Brent Harris was last seen on July 06, 2020 by Dr. Shelia Media for routines remaining.  They ordered a coronary calcium score.  He was totally asymptomatic.  No chest pain or pressure with rest or exertion.  No exertional dyspnea.  No PND, orthopnea or edema.  Recent Hospitalizations: None  Reviewed  CV studies:    The following studies were reviewed today: (if available, images/films reviewed: From Epic Chart or Care Everywhere) . Coronary Calcium Score ([place]) 06/01/2020: Calcium score 137; LM 4, LAD 152, LCx 1, RCA 0.  Normal heart size.  Visible lung fields are clear.  Lymph nodes not enlarged.  Moderate calcified coronary artery plaque of the LAD.   Interval History:   Brent Harris   CV Review of Symptoms (Summary): no chest pain or dyspnea  on exertion positive for - May be a little more exercise intolerance now than he had been, more sedentary negative for - edema, irregular heartbeat, orthopnea, palpitations, paroxysmal nocturnal dyspnea, rapid heart rate, shortness of breath or Syncope/near syncope or TIA/amaurosis fugax, claudication  The patient does not have symptoms concerning for COVID-19 infection (fever, chills, cough, or new shortness of breath).   REVIEWED OF SYSTEMS   Review of Systems  Constitutional: Negative  for malaise/fatigue (Just not very active now with being caregiver for his wife.) and weight loss.  HENT: Negative for congestion and nosebleeds.   Respiratory: Negative for cough and shortness of breath.   Gastrointestinal: Negative for abdominal pain, blood in stool and melena.  Genitourinary: Negative for hematuria.  Musculoskeletal: Negative for back pain, falls and joint pain.  Neurological: Negative for dizziness and headaches.  Psychiatric/Behavioral: Negative for depression and memory loss. The patient is not nervous/anxious and does not have insomnia.        Somewhat upset about his wife's health, and struggling to be her caregiver.   I have reviewed and (if needed) personally updated the patient's problem list, medications, allergies, past medical and surgical history, social and family history.   PAST MEDICAL HISTORY   Past Medical History:  Diagnosis Date  . Coronary artery calcification of native artery   . Erectile dysfunction    Related to prostate cancer  . Essential hypertension   . History of chronic prostatitis   . History of external beam radiation therapy 01-03-2012  to 02-29-2012   prostate    . History of nephrolithiasis   . Hyperlipidemia   . Nocturia   . Prediabetes   . Prostate cancer [facility]) urologist-- dr wrenn/  oncologist-- dr Tammi Klippel   dx 05/ 2013-- Stage T1c, Gleason 6--  completed IMRT 02-29-2012  . Urethral polyp   . Wears glasses     PAST SURGICAL HISTORY   Past Surgical History:  Procedure Laterality Date  . CYSTOSCOPY WITH BIOPSY N/A 07/29/2018   Procedure: CYSTOSCOPY WITH BIOPSY;  Surgeon: [name], MD;  Location: [facility];  Service: Urology;  Laterality: N/A;  . EXTRACORPOREAL SHOCK WAVE LITHOTRIPSY Right 06/03/2017   Procedure: RIGHT EXTRACORPOREAL SHOCK WAVE LITHOTRIPSY (ESWL);  Surgeon: Festus Aloe, MD;  Location: [facility];  Service: Urology;  Laterality: Right;  . PROSTATE BIOPSY  10/09/2011   Procedure: BIOPSY  TRANSRECTAL ULTRASONIC PROSTATE (TUBP);  Surgeon: Malka So, MD;  Location: [facility];  Service: Urology;  Laterality: N/A;  1 hour requested for this case  [facility] to bring Ultrasound Equiphment  . SURG FOR ARM FX  AS TEENAGER    Immunization History  Administered Date(s) Administered  . PFIZER(Purple Top)SARS-COV-2 Vaccination 07/06/2019, 08/01/2019    MEDICATIONS/ALLERGIES   Current Meds  Medication Sig  . aspirin 81 MG chewable tablet Chew 81 mg by mouth daily.  . Calcium Carbonate (CALCIUM 600 PO) Take by mouth daily.  . cholecalciferol (VITAMIN D) 1000 UNITS tablet Take 1,000 Units by mouth daily.  Marland Kitchen lisinopril (PRINIVIL,ZESTRIL) 20 MG tablet Take 20 mg by mouth every morning.   Marland Kitchen MAGNESIUM PO Take 1 tablet by mouth daily.  . Misc Natural Products (SAW PALMETTO) CAPS Take 1 capsule by mouth daily.  . Multiple Vitamin (MULTIVITAMIN) tablet Take 1 tablet by mouth daily.  . Red Yeast Rice Extract (RED YEAST RICE PO) Take 1 capsule by mouth daily.  . rosuvastatin (CRESTOR) 20 MG tablet Take 20 mg by mouth daily.  Marland Kitchen  valACYclovir (VALTREX) 1000 MG tablet Take 1,000 mg by mouth 2 (two) times daily.    Allergies  Allergen Reactions  . Nutritional Supplements Other (See Comments)    ALMONDS--  SINUS ALLERGY SYMPTOMS/  IF USED AS INGREDIENT IN SHAMPOO CAUSES RASH  . Onion Other (See Comments)    MOOD DISORDER    SOCIAL HISTORY/FAMILY HISTORY   Reviewed in Epic:  Pertinent findings:  Social History   Tobacco Use  . Smoking status: Former Smoker    Years: 2.00    Types: Cigarettes    Quit date: 07/25/1967    Years since quitting: 53.0  . Smokeless tobacco: Never Used  . Tobacco comment: TEENAGER TO COLLE[employer] SOCIALLY  Vaping Use  . Vaping Use: Never used  Substance Use Topics  . Alcohol use: Yes    Comment: SOCIAL  . 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  He also is a Curator himself.  He paints now for fun, but has done some to order painting.      He is married without any children.  He lives with his wife.  Routinely exercise about 2-3 times a week doing cardio, yoga and stretching exercises.  Some weights.  He also does elliptical machine 4 to 5 days a week.  He tries to do by 45 minutes of cardio exercise to get his heart rate up to 150 beats minute.      Over the last couple months, his wife was diagnosed with stage IV breast cancer and that was going through lots of treatments.  She had COVID in 2020.  She subsequently has developed metastatic disease and is on chemotherapy.  He has now become her primary caregiver which has kept him from doing his exercises as much as he would like to.Alycia Patten -PE, EKG, labs   Wt Readings from Last 3 Encounters:  07/22/20 177 lb (80.3 kg)  07/29/18 163 lb 4.8 oz (74.1 kg)  09/29/12 165 lb 13.9 oz (75.2 kg)    Physical Exam: BP 130/80 (BP Location: Left Arm, Patient Position: Sitting, Cuff Size: Normal)   Pulse 81   Ht 5\' 11"  (1.803 m)   Wt 177 lb (80.3 kg)   SpO2 98%   BMI 24.69 kg/m  Physical Exam Constitutional:      General: He is not in acute distress.    Appearance: Normal appearance. He is normal weight. He is not toxic-appearing.     Comments: Well-groomed.  Healthy-appearing  HENT:     Head: Normocephalic and atraumatic.  Neck:     Vascular: No carotid bruit.  Cardiovascular:     Rate and Rhythm: Normal rate and regular rhythm.     Pulses: Normal pulses.     Heart sounds: Normal heart sounds. No murmur heard. No friction rub. No gallop.   Pulmonary:     Effort: Pulmonary effort is normal. No respiratory distress.     Breath sounds: Normal breath sounds.  Chest:     Chest wall: No tenderness.  Abdominal:     General: There is no distension.     Palpations: Abdomen is soft. There is no mass (No HSM).     Tenderness: There is no abdominal  tenderness.  Musculoskeletal:        General: No swelling. Normal range of motion.     Cervical back:  Normal range of motion and neck supple.  Skin:    General: Skin is warm and dry.  Neurological:     General: No focal deficit present.     Mental Status: He is alert and oriented to person, place, and time.     Cranial Nerves: No cranial nerve deficit.     Gait: Gait normal.  Psychiatric:        Mood and Affect: Mood normal.        Behavior: Behavior normal.        Thought Content: Thought content normal.        Judgment: Judgment normal.      Adult ECG Report From PCP: NSR-74 bpm.  Left atrial enlargement.  Otherwise normal axis, intervals and durations.  Rate: 81 ;  Rhythm: normal sinus rhythm and Normal axis, intervals and durations.;   Narrative Interpretation: Normal  Recent Labs:   05/10/2020  Na+ 143, K+ 4.6, Cl- 104, HCO3-21, BUN 16, Cr 1.1, Glu 112, Ca2+ 8.6; AST 15, ALT 28, AlkP 55  CBC: W 5.2, H/H 14.2/41, Plt 253  TC 223, TG 99, HDL 51, LDL 154  PSA 0.679 improvement  No results found for: CHOL, HDL, LDLCALC, LDLDIRECT, TRIG, CHOLHDL Lab Results  Component Value Date   NA 136 07/29/2018   K 4.0 07/29/2018   CBC Latest Ref Rng & Units 07/29/2018 10/09/2011  Hemoglobin 13.0 - 17.0 g/dL 12.6(L) 14.6  Hematocrit 39.0 - 52.0 % 37.0(L) 43.0    No results found for: TSH  ==================================================  COVID-19 Education: The signs and symptoms of COVID-19 were discussed with the patient and how to seek care for testing (follow up with PCP or arrange E-visit).   The importance of social distancing and COVID-19 vaccination was discussed today. The patient is practicing social distancing & Masking.   I spent a total of 35 minutes with the patient spent in direct patient consultation.  => Spent some time talking about the concept of coronary artery calcification and CAD.  We talked about his symptoms I explained the pathophysiology of CAD  and further testing.  Additional time spent with chart review  / charting (studies, outside notes, etc): 63min -> New patient with clinic notes, multiple labs and studies reviewed. Total Time: 70min  Current medicines are reviewed at length with the patient today.  (+/- concerns) N/A  This visit occurred during the SARS-CoV-2 public health emergency.  Safety protocols were in place, including screening questions prior to the visit, additional usage of staff PPE, and extensive cleaning of exam room while observing appropriate contact time as indicated for disinfecting solutions.  Notice: This dictation was prepared with Dragon dictation along with smaller phrase technology. Any transcriptional errors that result from this process are unintentional and may not be corrected upon review.  Patient Instructions / Medication Changes & Studies & Tests Ordered   Patient Instructions  Medication Instructions:  No changes  *If you need a refill on your cardiac medications before your next appointment, please call your pharmacy*   Lab Work: Not  needed   Testing/Procedures: Not needed   Follow-Up: At [facility], you and your health needs are our priority.  As part of our continuing mission to provide you with exceptional heart care, we have created designated Provider Care Teams.  These Care Teams include your primary Cardiologist (physician) and Advanced Practice Providers (APPs -  Physician Assistants and Nurse Practitioners) who all work together to provide you with the care you need, when you  need it.  We recommend signing up for the patient portal called "MyChart".  Sign up information is provided on this After Visit Summary.  MyChart is used to connect with patients for Virtual Visits (Telemedicine).  Patients are able to view lab/test results, encounter notes, upcoming appointments, etc.  Non-urgent messages can be sent to your provider as well.   To learn more about what you can do  with MyChart, go to [email or web address].    Your next appointment:   12 month(s)  The format for your next appointment:   In Person  Provider:   Glenetta Hew, MD     Studies Ordered:   Orders Placed This Encounter  Procedures  . EKG 12-Lead     Glenetta Hew, M.D., M.S. Interventional Cardiologist   Pager # [phone] Phone # [phone] [address] [place], [place] [address]   Thank you for choosing [facility]!!

## 2020-07-22 NOTE — Patient Instructions (Addendum)

## 2020-08-15 ENCOUNTER — Encounter: Payer: Self-pay | Admitting: Cardiology

## 2020-08-15 NOTE — Assessment & Plan Note (Signed)
Coronary Calcium Score 150 is borderline elevated, not overly concerning, especially the patient is not actively having any chest pain or symptoms.  We will continue to monitor, if he were to have symptoms of exertional dyspnea, chest pain or simply worsening exercise tolerance, we could consider ischemic evaluation, most likely with Coronary CTA, however Myoview would be reasonable since there is no suggestion of lesions in the left main.  However, with no active symptoms, there is no indication to test.  Plan for now will be to continue aggressive Margaretann Loveless modification with blood pressure and lipid control. The use of prophylactic aspirin is probably not necessary at this point, with exception of back to all of the calcium.  All the calcium appears to be in one-vessel-the LAD.  Plan: Continue aspirin, statin and ACE inhibitor.

## 2020-08-15 NOTE — Assessment & Plan Note (Signed)
Blood pressure pretty well controlled.  On ACE inhibitor.

## 2020-08-15 NOTE — Assessment & Plan Note (Signed)
Coronary Score somewhat elevated.  156 is not overly concerning.  With no active symptoms, no need for further testing at this point, simply increase intensity of medical management.

## 2020-08-15 NOTE — Assessment & Plan Note (Signed)
With given his cardiac risk now being increased with a coronary calcium score, not unreasonable to shoot for a target LDL less than 100 if not less than 70.  Was just started on statin recently.  We also talked about dietary modification with exercise.

## 2020-12-15 DIAGNOSIS — L309 Dermatitis, unspecified: Secondary | ICD-10-CM | POA: Diagnosis not present

## 2021-01-03 ENCOUNTER — Telehealth: Payer: Self-pay | Admitting: Dermatology

## 2021-01-03 NOTE — Telephone Encounter (Signed)
Notes documented and referral closed.

## 2021-01-03 NOTE — Telephone Encounter (Signed)
Patient is calling about the referral appointment from Deland Pretty, M.D.  Patient says that he no longer needs the appointment because the rash  or problem is clearing and he thinks it was ringworm.  Patient does not want to schedule an appointment.

## 2021-04-25 DIAGNOSIS — Z8546 Personal history of malignant neoplasm of prostate: Secondary | ICD-10-CM | POA: Diagnosis not present

## 2021-05-01 DIAGNOSIS — Z87442 Personal history of urinary calculi: Secondary | ICD-10-CM | POA: Diagnosis not present

## 2021-05-01 DIAGNOSIS — R3121 Asymptomatic microscopic hematuria: Secondary | ICD-10-CM | POA: Diagnosis not present

## 2021-05-01 DIAGNOSIS — N5201 Erectile dysfunction due to arterial insufficiency: Secondary | ICD-10-CM | POA: Diagnosis not present

## 2021-05-01 DIAGNOSIS — Z8546 Personal history of malignant neoplasm of prostate: Secondary | ICD-10-CM | POA: Diagnosis not present

## 2021-05-26 DIAGNOSIS — E7801 Familial hypercholesterolemia: Secondary | ICD-10-CM | POA: Diagnosis not present

## 2021-05-26 DIAGNOSIS — I10 Essential (primary) hypertension: Secondary | ICD-10-CM | POA: Diagnosis not present

## 2021-05-26 DIAGNOSIS — Z125 Encounter for screening for malignant neoplasm of prostate: Secondary | ICD-10-CM | POA: Diagnosis not present

## 2021-05-30 DIAGNOSIS — R7309 Other abnormal glucose: Secondary | ICD-10-CM | POA: Diagnosis not present

## 2021-05-30 DIAGNOSIS — I10 Essential (primary) hypertension: Secondary | ICD-10-CM | POA: Diagnosis not present

## 2021-05-30 DIAGNOSIS — I2510 Atherosclerotic heart disease of native coronary artery without angina pectoris: Secondary | ICD-10-CM | POA: Diagnosis not present

## 2021-05-30 DIAGNOSIS — N529 Male erectile dysfunction, unspecified: Secondary | ICD-10-CM | POA: Diagnosis not present

## 2021-05-30 DIAGNOSIS — N400 Enlarged prostate without lower urinary tract symptoms: Secondary | ICD-10-CM | POA: Diagnosis not present

## 2021-05-30 DIAGNOSIS — Z8546 Personal history of malignant neoplasm of prostate: Secondary | ICD-10-CM | POA: Diagnosis not present

## 2021-05-30 DIAGNOSIS — Z0000 Encounter for general adult medical examination without abnormal findings: Secondary | ICD-10-CM | POA: Diagnosis not present

## 2022-05-31 DIAGNOSIS — Z125 Encounter for screening for malignant neoplasm of prostate: Secondary | ICD-10-CM | POA: Diagnosis not present

## 2022-05-31 DIAGNOSIS — R7309 Other abnormal glucose: Secondary | ICD-10-CM | POA: Diagnosis not present

## 2022-05-31 DIAGNOSIS — I2584 Coronary atherosclerosis due to calcified coronary lesion: Secondary | ICD-10-CM | POA: Diagnosis not present

## 2022-06-04 DIAGNOSIS — C61 Malignant neoplasm of prostate: Secondary | ICD-10-CM | POA: Diagnosis not present

## 2022-06-04 DIAGNOSIS — I10 Essential (primary) hypertension: Secondary | ICD-10-CM | POA: Diagnosis not present

## 2022-06-04 DIAGNOSIS — Z0000 Encounter for general adult medical examination without abnormal findings: Secondary | ICD-10-CM | POA: Diagnosis not present

## 2022-06-04 DIAGNOSIS — N529 Male erectile dysfunction, unspecified: Secondary | ICD-10-CM | POA: Diagnosis not present

## 2022-06-04 DIAGNOSIS — Z923 Personal history of irradiation: Secondary | ICD-10-CM | POA: Diagnosis not present

## 2022-06-04 DIAGNOSIS — Z23 Encounter for immunization: Secondary | ICD-10-CM | POA: Diagnosis not present

## 2022-06-04 DIAGNOSIS — I2510 Atherosclerotic heart disease of native coronary artery without angina pectoris: Secondary | ICD-10-CM | POA: Diagnosis not present

## 2022-12-10 DIAGNOSIS — M79605 Pain in left leg: Secondary | ICD-10-CM | POA: Diagnosis not present

## 2022-12-10 DIAGNOSIS — R269 Unspecified abnormalities of gait and mobility: Secondary | ICD-10-CM | POA: Diagnosis not present

## 2023-06-06 DIAGNOSIS — Z125 Encounter for screening for malignant neoplasm of prostate: Secondary | ICD-10-CM | POA: Diagnosis not present

## 2023-06-06 DIAGNOSIS — E7800 Pure hypercholesterolemia, unspecified: Secondary | ICD-10-CM | POA: Diagnosis not present

## 2023-06-06 DIAGNOSIS — I10 Essential (primary) hypertension: Secondary | ICD-10-CM | POA: Diagnosis not present

## 2023-06-06 DIAGNOSIS — R7309 Other abnormal glucose: Secondary | ICD-10-CM | POA: Diagnosis not present

## 2023-06-10 DIAGNOSIS — Z0000 Encounter for general adult medical examination without abnormal findings: Secondary | ICD-10-CM | POA: Diagnosis not present

## 2023-06-10 DIAGNOSIS — N400 Enlarged prostate without lower urinary tract symptoms: Secondary | ICD-10-CM | POA: Diagnosis not present

## 2023-06-10 DIAGNOSIS — C61 Malignant neoplasm of prostate: Secondary | ICD-10-CM | POA: Diagnosis not present

## 2023-06-10 DIAGNOSIS — I10 Essential (primary) hypertension: Secondary | ICD-10-CM | POA: Diagnosis not present

## 2023-06-10 DIAGNOSIS — J302 Other seasonal allergic rhinitis: Secondary | ICD-10-CM | POA: Diagnosis not present

## 2023-06-10 DIAGNOSIS — N529 Male erectile dysfunction, unspecified: Secondary | ICD-10-CM | POA: Diagnosis not present

## 2023-06-10 DIAGNOSIS — I2510 Atherosclerotic heart disease of native coronary artery without angina pectoris: Secondary | ICD-10-CM | POA: Diagnosis not present

## 2023-06-18 DIAGNOSIS — E349 Endocrine disorder, unspecified: Secondary | ICD-10-CM | POA: Diagnosis not present

## 2023-06-18 DIAGNOSIS — Z8546 Personal history of malignant neoplasm of prostate: Secondary | ICD-10-CM | POA: Diagnosis not present

## 2023-06-18 DIAGNOSIS — N5201 Erectile dysfunction due to arterial insufficiency: Secondary | ICD-10-CM | POA: Diagnosis not present

## 2023-06-25 DIAGNOSIS — E349 Endocrine disorder, unspecified: Secondary | ICD-10-CM | POA: Diagnosis not present

## 2023-07-22 DIAGNOSIS — I2510 Atherosclerotic heart disease of native coronary artery without angina pectoris: Secondary | ICD-10-CM | POA: Diagnosis not present

## 2023-07-29 DIAGNOSIS — E349 Endocrine disorder, unspecified: Secondary | ICD-10-CM | POA: Diagnosis not present

## 2023-10-02 DIAGNOSIS — H5213 Myopia, bilateral: Secondary | ICD-10-CM | POA: Diagnosis not present

## 2023-10-02 DIAGNOSIS — H52223 Regular astigmatism, bilateral: Secondary | ICD-10-CM | POA: Diagnosis not present

## 2023-10-02 DIAGNOSIS — H524 Presbyopia: Secondary | ICD-10-CM | POA: Diagnosis not present

## 2023-12-18 DIAGNOSIS — N5201 Erectile dysfunction due to arterial insufficiency: Secondary | ICD-10-CM | POA: Diagnosis not present

## 2023-12-18 DIAGNOSIS — E349 Endocrine disorder, unspecified: Secondary | ICD-10-CM | POA: Diagnosis not present

## 2023-12-18 DIAGNOSIS — C61 Malignant neoplasm of prostate: Secondary | ICD-10-CM | POA: Diagnosis not present
